# Patient Record
Sex: Male | Born: 1970 | Race: White | Hispanic: Yes | Marital: Married | Smoking: Never smoker
Health system: Southern US, Community
[De-identification: ages and names within clinical notes are randomized; demographics above are authoritative.]

## PROBLEM LIST (undated history)

## (undated) DIAGNOSIS — M1990 Unspecified osteoarthritis, unspecified site: Secondary | ICD-10-CM

## (undated) HISTORY — PX: APPENDECTOMY: SHX54

## (undated) HISTORY — PX: THYROGLOSSAL DUCT CYST: SHX297

---

## 2007-02-11 ENCOUNTER — Ambulatory Visit (HOSPITAL_COMMUNITY): Admission: EM | Admit: 2007-02-11 | Discharge: 2007-02-11 | Payer: Self-pay | Admitting: Emergency Medicine

## 2007-02-11 ENCOUNTER — Encounter (INDEPENDENT_AMBULATORY_CARE_PROVIDER_SITE_OTHER): Payer: Self-pay | Admitting: General Surgery

## 2008-03-09 ENCOUNTER — Ambulatory Visit: Payer: Self-pay | Admitting: Internal Medicine

## 2008-03-09 ENCOUNTER — Encounter (INDEPENDENT_AMBULATORY_CARE_PROVIDER_SITE_OTHER): Payer: Self-pay | Admitting: Family Medicine

## 2008-03-09 ENCOUNTER — Ambulatory Visit: Payer: Self-pay | Admitting: *Deleted

## 2008-03-09 LAB — CONVERTED CEMR LAB
ALT: 12 units/L (ref 0–53)
AST: 14 units/L (ref 0–37)
Albumin: 4 g/dL (ref 3.5–5.2)
Alkaline Phosphatase: 76 units/L (ref 39–117)
BUN: 21 mg/dL (ref 6–23)
Basophils Relative: 0 % (ref 0–1)
CO2: 21 meq/L (ref 19–32)
Calcium: 9.4 mg/dL (ref 8.4–10.5)
Creatinine, Ser: 0.73 mg/dL (ref 0.40–1.50)
Eosinophils Absolute: 0.1 10*3/uL (ref 0.0–0.7)
Eosinophils Relative: 1 % (ref 0–5)
Glucose, Bld: 88 mg/dL (ref 70–99)
HCT: 37.7 % — ABNORMAL LOW (ref 39.0–52.0)
Lymphs Abs: 1.8 10*3/uL (ref 0.7–4.0)
MCHC: 32.9 g/dL (ref 30.0–36.0)
MCV: 88.1 fL (ref 78.0–100.0)
Monocytes Absolute: 0.6 10*3/uL (ref 0.1–1.0)
Monocytes Relative: 9 % (ref 3–12)
Neutrophils Relative %: 63 % (ref 43–77)
RBC: 4.28 M/uL (ref 4.22–5.81)
Sodium: 139 meq/L (ref 135–145)
Total Bilirubin: 0.5 mg/dL (ref 0.3–1.2)
WBC: 6.8 10*3/uL (ref 4.0–10.5)

## 2008-04-01 ENCOUNTER — Ambulatory Visit: Payer: Self-pay | Admitting: Internal Medicine

## 2008-04-01 LAB — CONVERTED CEMR LAB
LDL Cholesterol: 74 mg/dL (ref 0–99)
TSH: 0.786 microintl units/mL (ref 0.350–4.50)

## 2008-04-02 ENCOUNTER — Ambulatory Visit (HOSPITAL_COMMUNITY): Admission: RE | Admit: 2008-04-02 | Discharge: 2008-04-02 | Payer: Self-pay | Admitting: Internal Medicine

## 2008-04-02 ENCOUNTER — Ambulatory Visit: Payer: Self-pay | Admitting: Internal Medicine

## 2008-04-09 ENCOUNTER — Ambulatory Visit: Payer: Self-pay | Admitting: Internal Medicine

## 2008-04-14 ENCOUNTER — Ambulatory Visit: Payer: Self-pay | Admitting: Internal Medicine

## 2008-04-14 LAB — CONVERTED CEMR LAB
ALT: 14 units/L (ref 0–53)
Albumin: 4 g/dL (ref 3.5–5.2)
Alkaline Phosphatase: 67 units/L (ref 39–117)
Basophils Relative: 0 % (ref 0–1)
Glucose, Bld: 92 mg/dL (ref 70–99)
Lymphs Abs: 4.5 10*3/uL — ABNORMAL HIGH (ref 0.7–4.0)
MCHC: 33.4 g/dL (ref 30.0–36.0)
Monocytes Relative: 6 % (ref 3–12)
Neutro Abs: 5.6 10*3/uL (ref 1.7–7.7)
Neutrophils Relative %: 51 % (ref 43–77)
Potassium: 4.2 meq/L (ref 3.5–5.3)
RBC: 4.26 M/uL (ref 4.22–5.81)
Sodium: 138 meq/L (ref 135–145)
Total Protein: 7.3 g/dL (ref 6.0–8.3)
WBC: 10.9 10*3/uL — ABNORMAL HIGH (ref 4.0–10.5)

## 2008-04-16 ENCOUNTER — Ambulatory Visit: Payer: Self-pay | Admitting: Internal Medicine

## 2008-04-22 ENCOUNTER — Ambulatory Visit: Payer: Self-pay | Admitting: Internal Medicine

## 2008-04-29 ENCOUNTER — Ambulatory Visit: Payer: Self-pay | Admitting: Internal Medicine

## 2008-05-06 ENCOUNTER — Ambulatory Visit: Payer: Self-pay | Admitting: Internal Medicine

## 2008-05-13 ENCOUNTER — Ambulatory Visit: Payer: Self-pay | Admitting: Internal Medicine

## 2008-05-20 ENCOUNTER — Ambulatory Visit: Payer: Self-pay | Admitting: Internal Medicine

## 2008-05-27 ENCOUNTER — Ambulatory Visit: Payer: Self-pay | Admitting: Internal Medicine

## 2008-06-03 ENCOUNTER — Ambulatory Visit: Payer: Self-pay | Admitting: Internal Medicine

## 2008-06-10 ENCOUNTER — Ambulatory Visit: Payer: Self-pay | Admitting: Internal Medicine

## 2008-06-17 ENCOUNTER — Ambulatory Visit: Payer: Self-pay | Admitting: Internal Medicine

## 2008-07-18 ENCOUNTER — Ambulatory Visit: Payer: Self-pay | Admitting: Internal Medicine

## 2008-07-25 ENCOUNTER — Ambulatory Visit: Payer: Self-pay | Admitting: Internal Medicine

## 2008-08-31 ENCOUNTER — Ambulatory Visit: Payer: Self-pay | Admitting: Internal Medicine

## 2008-08-31 LAB — CONVERTED CEMR LAB
ALT: 22 units/L (ref 0–53)
AST: 20 units/L (ref 0–37)
Albumin: 4.5 g/dL (ref 3.5–5.2)
Alkaline Phosphatase: 57 units/L (ref 39–117)
Basophils Absolute: 0 10*3/uL (ref 0.0–0.1)
Basophils Relative: 0 % (ref 0–1)
Calcium: 9.7 mg/dL (ref 8.4–10.5)
Chloride: 102 meq/L (ref 96–112)
MCHC: 33.2 g/dL (ref 30.0–36.0)
Monocytes Relative: 8 % (ref 3–12)
Neutro Abs: 6.6 10*3/uL (ref 1.7–7.7)
Neutrophils Relative %: 57 % (ref 43–77)
Platelets: 337 10*3/uL (ref 150–400)
Potassium: 4.1 meq/L (ref 3.5–5.3)
RBC: 4.9 M/uL (ref 4.22–5.81)
RDW: 12.8 % (ref 11.5–15.5)
Sodium: 139 meq/L (ref 135–145)

## 2008-09-02 ENCOUNTER — Ambulatory Visit: Payer: Self-pay | Admitting: Internal Medicine

## 2008-09-19 ENCOUNTER — Ambulatory Visit: Payer: Self-pay | Admitting: Internal Medicine

## 2008-10-05 ENCOUNTER — Ambulatory Visit: Payer: Self-pay | Admitting: Internal Medicine

## 2008-10-12 ENCOUNTER — Ambulatory Visit: Payer: Self-pay | Admitting: Internal Medicine

## 2008-10-21 ENCOUNTER — Ambulatory Visit: Payer: Self-pay | Admitting: Internal Medicine

## 2008-11-08 ENCOUNTER — Ambulatory Visit: Payer: Self-pay | Admitting: Internal Medicine

## 2008-11-08 LAB — CONVERTED CEMR LAB
ALT: 28 units/L (ref 0–53)
AST: 20 units/L (ref 0–37)
Alkaline Phosphatase: 70 units/L (ref 39–117)
BUN: 16 mg/dL (ref 6–23)
Basophils Absolute: 0 10*3/uL (ref 0.0–0.1)
Basophils Relative: 0 % (ref 0–1)
Calcium: 9.8 mg/dL (ref 8.4–10.5)
Chloride: 101 meq/L (ref 96–112)
Creatinine, Ser: 0.96 mg/dL (ref 0.40–1.50)
Eosinophils Relative: 0 % (ref 0–5)
Lymphocytes Relative: 15 % (ref 12–46)
MCHC: 33.8 g/dL (ref 30.0–36.0)
Neutro Abs: 7.7 10*3/uL (ref 1.7–7.7)
Platelets: 336 10*3/uL (ref 150–400)
Potassium: 4.4 meq/L (ref 3.5–5.3)
RDW: 12.4 % (ref 11.5–15.5)
Sed Rate: 9 mm/hr (ref 0–16)

## 2008-12-01 ENCOUNTER — Ambulatory Visit: Payer: Self-pay | Admitting: Internal Medicine

## 2009-01-17 ENCOUNTER — Ambulatory Visit: Payer: Self-pay | Admitting: Internal Medicine

## 2009-05-25 ENCOUNTER — Ambulatory Visit: Payer: Self-pay | Admitting: Internal Medicine

## 2009-06-14 ENCOUNTER — Ambulatory Visit (HOSPITAL_COMMUNITY): Admission: RE | Admit: 2009-06-14 | Discharge: 2009-06-14 | Payer: Self-pay | Admitting: Internal Medicine

## 2009-07-04 ENCOUNTER — Ambulatory Visit: Payer: Self-pay | Admitting: Internal Medicine

## 2009-07-26 ENCOUNTER — Ambulatory Visit: Payer: Self-pay | Admitting: Internal Medicine

## 2009-08-14 ENCOUNTER — Ambulatory Visit: Payer: Self-pay | Admitting: Internal Medicine

## 2009-09-06 ENCOUNTER — Ambulatory Visit: Payer: Self-pay | Admitting: Internal Medicine

## 2009-09-06 LAB — CONVERTED CEMR LAB
AST: 25 units/L (ref 0–37)
Albumin: 4.5 g/dL (ref 3.5–5.2)
Alkaline Phosphatase: 57 units/L (ref 39–117)
BUN: 13 mg/dL (ref 6–23)
Basophils Absolute: 0 10*3/uL (ref 0.0–0.1)
Basophils Relative: 0 % (ref 0–1)
Creatinine, Ser: 0.77 mg/dL (ref 0.40–1.50)
Eosinophils Absolute: 0 10*3/uL (ref 0.0–0.7)
Eosinophils Relative: 0 % (ref 0–5)
Glucose, Bld: 104 mg/dL — ABNORMAL HIGH (ref 70–99)
HCT: 46.5 % (ref 39.0–52.0)
Hemoglobin: 15.1 g/dL (ref 13.0–17.0)
Lymphocytes Relative: 15 % (ref 12–46)
MCHC: 32.5 g/dL (ref 30.0–36.0)
Monocytes Absolute: 0.4 10*3/uL (ref 0.1–1.0)
Platelets: 345 10*3/uL (ref 150–400)
RDW: 13.3 % (ref 11.5–15.5)
Total Bilirubin: 0.5 mg/dL (ref 0.3–1.2)

## 2009-09-18 ENCOUNTER — Encounter (INDEPENDENT_AMBULATORY_CARE_PROVIDER_SITE_OTHER): Payer: Self-pay | Admitting: *Deleted

## 2009-11-01 ENCOUNTER — Ambulatory Visit: Payer: Self-pay | Admitting: Internal Medicine

## 2009-11-01 LAB — CONVERTED CEMR LAB
Albumin: 4.4 g/dL (ref 3.5–5.2)
BUN: 21 mg/dL (ref 6–23)
Basophils Relative: 0 % (ref 0–1)
CO2: 23 meq/L (ref 19–32)
Eosinophils Relative: 0 % (ref 0–5)
Glucose, Bld: 99 mg/dL (ref 70–99)
HCT: 45.2 % (ref 39.0–52.0)
Hemoglobin: 15.1 g/dL (ref 13.0–17.0)
MCHC: 33.4 g/dL (ref 30.0–36.0)
Monocytes Absolute: 0.6 10*3/uL (ref 0.1–1.0)
Monocytes Relative: 5 % (ref 3–12)
Neutro Abs: 9.2 10*3/uL — ABNORMAL HIGH (ref 1.7–7.7)
Potassium: 4.7 meq/L (ref 3.5–5.3)
RBC: 4.73 M/uL (ref 4.22–5.81)
RDW: 12.7 % (ref 11.5–15.5)
Sodium: 140 meq/L (ref 135–145)
Total Bilirubin: 0.4 mg/dL (ref 0.3–1.2)
Total Protein: 7.4 g/dL (ref 6.0–8.3)

## 2010-02-05 ENCOUNTER — Ambulatory Visit: Payer: Self-pay | Admitting: Internal Medicine

## 2010-02-05 LAB — CONVERTED CEMR LAB
ALT: 17 units/L (ref 0–53)
Albumin: 4.1 g/dL (ref 3.5–5.2)
CO2: 27 meq/L (ref 19–32)
Calcium: 9.4 mg/dL (ref 8.4–10.5)
Chloride: 104 meq/L (ref 96–112)
Hemoglobin: 14.4 g/dL (ref 13.0–17.0)
Lymphs Abs: 3 10*3/uL (ref 0.7–4.0)
Monocytes Relative: 6 % (ref 3–12)
Neutro Abs: 5.4 10*3/uL (ref 1.7–7.7)
Neutrophils Relative %: 59 % (ref 43–77)
Potassium: 4.5 meq/L (ref 3.5–5.3)
RBC: 4.6 M/uL (ref 4.22–5.81)
Sodium: 139 meq/L (ref 135–145)
Total Protein: 6.8 g/dL (ref 6.0–8.3)
WBC: 9.1 10*3/uL (ref 4.0–10.5)

## 2010-06-14 ENCOUNTER — Encounter (INDEPENDENT_AMBULATORY_CARE_PROVIDER_SITE_OTHER): Payer: Self-pay | Admitting: Internal Medicine

## 2010-06-14 LAB — CONVERTED CEMR LAB
AST: 27 units/L (ref 0–37)
Albumin: 3.9 g/dL (ref 3.5–5.2)
Alkaline Phosphatase: 61 units/L (ref 39–117)
Eosinophils Absolute: 0.1 10*3/uL (ref 0.0–0.7)
Glucose, Bld: 91 mg/dL (ref 70–99)
Lymphocytes Relative: 17 % (ref 12–46)
Lymphs Abs: 1.8 10*3/uL (ref 0.7–4.0)
MCV: 92.8 fL (ref 78.0–100.0)
Neutro Abs: 8 10*3/uL — ABNORMAL HIGH (ref 1.7–7.7)
Neutrophils Relative %: 75 % (ref 43–77)
Platelets: 370 10*3/uL (ref 150–400)
Potassium: 4.8 meq/L (ref 3.5–5.3)
Sodium: 140 meq/L (ref 135–145)
Total Protein: 7 g/dL (ref 6.0–8.3)
WBC: 10.6 10*3/uL — ABNORMAL HIGH (ref 4.0–10.5)

## 2010-10-16 NOTE — Letter (Signed)
Summary: Generic Letter  [facility]  [address]   [place], [place] [address]   Phone: [phone]  Fax: [phone]    09/18/2009  [facility] [address] [place], [place]  [address]  Dear Mr. [facility] IBARRA,   Dr. Reche Dixon has reviewed your lab work, it was normal. Follow-up in 8 weeks as discussed on your last visit.        Sincerely,   Gaylyn Cheers RN

## 2010-11-09 ENCOUNTER — Other Ambulatory Visit (HOSPITAL_COMMUNITY): Payer: Self-pay | Admitting: Family Medicine

## 2010-11-09 ENCOUNTER — Encounter (INDEPENDENT_AMBULATORY_CARE_PROVIDER_SITE_OTHER): Payer: Self-pay | Admitting: *Deleted

## 2010-11-09 DIAGNOSIS — M1990 Unspecified osteoarthritis, unspecified site: Secondary | ICD-10-CM

## 2010-11-09 LAB — CONVERTED CEMR LAB
AST: 25 units/L (ref 0–37)
Albumin: 4.4 g/dL (ref 3.5–5.2)
Alkaline Phosphatase: 65 units/L (ref 39–117)
Basophils Absolute: 0 10*3/uL (ref 0.0–0.1)
Basophils Relative: 0 % (ref 0–1)
Eosinophils Absolute: 0.1 10*3/uL (ref 0.0–0.7)
MCHC: 33.3 g/dL (ref 30.0–36.0)
MCV: 97.3 fL (ref 78.0–100.0)
Neutrophils Relative %: 59 % (ref 43–77)
Platelets: 308 10*3/uL (ref 150–400)
Potassium: 4.5 meq/L (ref 3.5–5.3)
Sodium: 141 meq/L (ref 135–145)
Total Protein: 7.7 g/dL (ref 6.0–8.3)
WBC: 8.4 10*3/uL (ref 4.0–10.5)

## 2010-11-20 ENCOUNTER — Other Ambulatory Visit: Payer: Self-pay | Admitting: Family Medicine

## 2010-11-21 ENCOUNTER — Ambulatory Visit (HOSPITAL_COMMUNITY)
Admission: RE | Admit: 2010-11-21 | Discharge: 2010-11-21 | Disposition: A | Discharge status: Home / Self Care | Payer: Self-pay | Source: Ambulatory Visit | Attending: Family Medicine | Admitting: Family Medicine

## 2010-11-21 DIAGNOSIS — Z79899 Other long term (current) drug therapy: Secondary | ICD-10-CM | POA: Insufficient documentation

## 2010-11-21 DIAGNOSIS — Z13820 Encounter for screening for osteoporosis: Secondary | ICD-10-CM | POA: Insufficient documentation

## 2010-11-21 DIAGNOSIS — M1990 Unspecified osteoarthritis, unspecified site: Secondary | ICD-10-CM

## 2010-12-01 ENCOUNTER — Encounter (INDEPENDENT_AMBULATORY_CARE_PROVIDER_SITE_OTHER): Payer: Self-pay | Admitting: *Deleted

## 2010-12-01 LAB — CONVERTED CEMR LAB
AST: 25 units/L (ref 0–37)
Albumin: 4.3 g/dL (ref 3.5–5.2)
Alkaline Phosphatase: 61 units/L (ref 39–117)
BUN: 17 mg/dL (ref 6–23)
Basophils Absolute: 0 10*3/uL (ref 0.0–0.1)
Basophils Relative: 0 % (ref 0–1)
Eosinophils Absolute: 0.1 10*3/uL (ref 0.0–0.7)
MCHC: 33.7 g/dL (ref 30.0–36.0)
Monocytes Absolute: 0.7 10*3/uL (ref 0.1–1.0)
Neutro Abs: 4.9 10*3/uL (ref 1.7–7.7)
Neutrophils Relative %: 59 % (ref 43–77)
Potassium: 4.1 meq/L (ref 3.5–5.3)
RDW: 12.7 % (ref 11.5–15.5)
Sodium: 141 meq/L (ref 135–145)
Total Bilirubin: 0.3 mg/dL (ref 0.3–1.2)

## 2011-01-29 NOTE — Op Note (Signed)
NAME:  Jacob Cole, Jacob Cole      ACCOUNT NO.:  [id number]   MEDICAL RECORD NO.:  [id number]          PATIENT TYPE:  EMS   LOCATION:  ED                           FACILITY:  [facility]   PHYSICIAN:  Anselm Pancoast. Weatherly, M.D.DATE OF BIRTH:  [birth year]   DATE OF PROCEDURE:  02/11/2007  DATE OF DISCHARGE:                               OPERATIVE REPORT   PREOPERATIVE DIAGNOSIS:  Acute appendicitis.   POSTOPERATIVE DIAGNOSIS:  Acute appendicitis.   OPERATION PERFORMED:  Appendectomy.   SURGEON:  Anselm Pancoast. Zachery Dakins, M.D.   ANESTHESIA:  General.   INDICATIONS FOR PROCEDURE:  Jacob Cole is a 40 year old  [place] American who came to the emergency room after being seen in an  urgent care with abdominal pain of about half day's duration.  He was  nauseated, was sent over, seen by the ER physician.  His white count was  21,000.  He gave history of actually epigastric pain and after they  further examined him and he was vaguely tender in the lower abdomen, CT  showed appendicitis in a retrocecal medial aspect area.  I was called  about 2:30 a.m., came in, examined him, gave him 3 g of Unasyn and  permission obtained for an open appendectomy.  Patient has no other  chronic medical problems and was taken over to the operative suite.   DESCRIPTION OF PROCEDURE:  Induction of general anesthesia endotracheal  tube, oral tube to the stomach and then the abdomen was clipped in the  right lower quadrant and then prepped with Betadine solution and draped  in a sterile manner.  A McBurney incision was made.  Sharp dissection  went through the skin, subcutaneous tissue, he was fairly thin, the  external oblique, internal oblique and then the peritoneum was picked up  between two hemostats and carefully opened into the peritoneal cavity.  Appendiceal and a Goulet were placed.  I could feel the markedly  inflamed appendix in the medial aspect and this was kind of manipulated  up into the  wound.  There was a very thick exudate on the appendix but  it does not appear to be actually ruptured.  I cultured the exudate  aerobid and anaerobically and then the appendiceal mesentery was clamped  between [facility] and ligated with 2-0 Vicryl.  The appendix was inflamed  right down to its base.  The base was clamped with hemostat and then  tied with 2-0 Vicryl and a second tie was placed as the appendix was  nearly gangrenous at this point.  The appendix was removed from the  field.  A pursestring suture of 3-0 silk placed, the stump inverted and  a Z-stitch was placed as to reinforced the closure.  The cecum was  placed back in its normal position and thoroughly irrigated sucking out  the fluids and returned clear and then the omentum was placed down over  the area where the appendix had been removed and then the peritoneum and  transversalis was closed with running 2-0 Vicryl and then the internal  oblique was closed with the running 2-0 Vicryl also.  External oblique  was closed with running 2-0  Vicryl.  The wound was irrigated between  layers and then the Scarpa fascia was closed with a few 4-0 Vicryl  interrupted sutures and three 4-0 Vicryl interrupted subcuticular and  half inch Steri-Strips and benzoin on the skin.  The patient tolerated  the procedure  nicely and I do want the patient to get a couple of doses of IV  antibiotics postoperatively and if he is not nauseated, we will let him  be released after lunch in the a.m. later today.  Sponge, needle and  instrument counts were correct x2.  The estimated blood loss was  minimal.           ______________________________  Anselm Pancoast. Zachery Dakins, M.D.     WJW/MEDQ  D:  02/11/2007  T:  02/11/2007  Job:  [id number]

## 2011-01-29 NOTE — H&P (Signed)
NAME:  Jacob Cole      ACCOUNT NO.:  [id number]   MEDICAL RECORD NO.:  [id number]          PATIENT TYPE:  EMS   LOCATION:  ED                           FACILITY:  [facility]   PHYSICIAN:  Anselm Pancoast. Weatherly, M.D.DATE OF BIRTH:  [birth year]   DATE OF ADMISSION:  02/10/2007  DATE OF DISCHARGE:                              HISTORY & PHYSICAL   CHIEF COMPLAINT:  Abdominal pain.   HISTORY:  Jacob Cole is a 40 year old [place] American who  came to the emergency room.  I think he was actually seen [facility], earlier, for abdominal pain of about half day's duration.  He was  seen in the emergency room by the emergency room physician and his  initial symptoms described as epigastric pain.  He arrived here at about  8:30.  The white blood cell count was markedly elevated.  He was not  febrile.  He was given nausea medicine and pain medication.  Then on re-  examination, felt he was vaguely tender in the lower abdomen.  CT of the  abdomen and the pelvis was obtained and showed a markedly inflamed  appendix, located very medially in the depths of the abdomen, not truly  a retrocecal but certainly not in the right lower quadrant.  I was  called, came in and examined the patient and agreed with the diagnosis.  He was given 3 g of Unasyn and permission obtained for an appendectomy.   PAST MEDICAL HISTORY:  No chronic medical problems.   ALLERGIES:  None.   SOCIAL HISTORY:  I am not sure what type of work he does.  He works in  [employer].  He has friends with him that speak better English than he  does.   PAST SURGICAL HISTORY:  No previous abdominal surgery.   PHYSICAL EXAMINATION:  VITAL SIGNS:  Temperature at approximately 2 a.m.  showed a temperature of 99.3, his blood pressure was 120/73, pulse is  108 and his respirations of 18.  LUNGS:  Clear.  CARDIAC:  Normal sinus rhythm.  ABDOMEN:  He is vaguely tender in the right lower quadrant.  It is not  really  true rebound type thing.  CENTRAL NERVOUS SYSTEM:  Physiologic.  EXTREMITIES:  There is no pedal edema.   ADMISSION IMPRESSION:  Acute appendicitis.   PLAN:  Plan for open appendectomy.           ______________________________  Anselm Pancoast. Zachery Dakins, M.D.    WJW/MEDQ  D:  02/11/2007  T:  02/11/2007  Job:  [id number]

## 2012-08-04 ENCOUNTER — Emergency Department (HOSPITAL_COMMUNITY): Payer: No Typology Code available for payment source

## 2012-08-04 ENCOUNTER — Encounter (HOSPITAL_COMMUNITY): Payer: Self-pay | Admitting: Radiology

## 2012-08-04 ENCOUNTER — Emergency Department (HOSPITAL_COMMUNITY)
Admission: EM | Admit: 2012-08-04 | Discharge: 2012-08-04 | Disposition: A | Discharge status: Home / Self Care | Payer: No Typology Code available for payment source | Attending: Emergency Medicine | Admitting: Emergency Medicine

## 2012-08-04 DIAGNOSIS — IMO0002 Reserved for concepts with insufficient information to code with codable children: Secondary | ICD-10-CM | POA: Insufficient documentation

## 2012-08-04 DIAGNOSIS — S0993XA Unspecified injury of face, initial encounter: Secondary | ICD-10-CM | POA: Insufficient documentation

## 2012-08-04 DIAGNOSIS — M129 Arthropathy, unspecified: Secondary | ICD-10-CM | POA: Insufficient documentation

## 2012-08-04 DIAGNOSIS — S199XXA Unspecified injury of neck, initial encounter: Secondary | ICD-10-CM | POA: Insufficient documentation

## 2012-08-04 DIAGNOSIS — Y92410 Unspecified street and highway as the place of occurrence of the external cause: Secondary | ICD-10-CM | POA: Insufficient documentation

## 2012-08-04 DIAGNOSIS — T148XXA Other injury of unspecified body region, initial encounter: Secondary | ICD-10-CM

## 2012-08-04 DIAGNOSIS — Z79899 Other long term (current) drug therapy: Secondary | ICD-10-CM | POA: Insufficient documentation

## 2012-08-04 DIAGNOSIS — Y9389 Activity, other specified: Secondary | ICD-10-CM | POA: Insufficient documentation

## 2012-08-04 HISTORY — DX: Unspecified osteoarthritis, unspecified site: M19.90

## 2012-08-04 MED ORDER — METHOCARBAMOL 500 MG PO TABS: 1000.0000 mg | ORAL_TABLET | Freq: Four times a day (QID) | ORAL | Status: DC
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
## 2012-08-04 NOTE — ED Provider Notes (Signed)
Medical screening examination/treatment/procedure(s) were performed by non-physician practitioner and as supervising physician I was immediately available for consultation/collaboration.   Gavin Pound. Elridge Stemm, MD 08/04/12 1134

## 2012-08-04 NOTE — ED Notes (Signed)
Pt removed off backboard at this time 

## 2012-08-04 NOTE — ED Notes (Signed)
Ambulated pt to bathroom. Pt denies any dizziness at this time

## 2012-08-04 NOTE — ED Notes (Signed)
PA at bedside.
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
## 2012-09-23 ENCOUNTER — Ambulatory Visit (INDEPENDENT_AMBULATORY_CARE_PROVIDER_SITE_OTHER): Payer: Self-pay | Admitting: Emergency Medicine

## 2012-09-23 ENCOUNTER — Encounter: Payer: Self-pay | Admitting: Emergency Medicine

## 2012-09-23 VITALS — BP 135/84 | HR 69 | Ht 70.5 in | Wt 174.0 lb

## 2012-09-23 DIAGNOSIS — B351 Tinea unguium: Secondary | ICD-10-CM

## 2012-09-23 DIAGNOSIS — M069 Rheumatoid arthritis, unspecified: Secondary | ICD-10-CM

## 2012-09-23 LAB — HEPATIC FUNCTION PANEL
AST: 82 U/L — ABNORMAL HIGH (ref 0–37)
Albumin: 4.5 g/dL (ref 3.5–5.2)
Alkaline Phosphatase: 68 U/L (ref 39–117)
Indirect Bilirubin: 0.5 mg/dL (ref 0.0–0.9)
Total Bilirubin: 0.6 mg/dL (ref 0.3–1.2)

## 2012-09-23 NOTE — Assessment & Plan Note (Signed)
Doing well by patient report on methotrexate and prednisone.  Continue current medications.  He will notify me when he needs refills.

## 2012-09-23 NOTE — Assessment & Plan Note (Signed)
Reviewed treatment options with patient including 3 months of oral antifungal vs nothing.  Informed him that the cost of the lab work would be $65 and the medication would be $300-500 per month.  He would like to receive treatment.  Liver panel drawn today; if normal will prescribe terbinafine 250mg  daily x12 weeks.  Will need to follow up in 8-12 weeks to repeat the liver panel.

## 2012-09-23 NOTE — Patient Instructions (Addendum)
It was nice to meet you! Call our office when you need refills.  Our number is [phone]. We are going to check your liver enzymes.  I will call you with the results.  If they are good I will send the medicine to treat the toe nail fungus.  It will cost $300-500 a month and you need to take it for 3 months. Follow up to retest your liver enzymes 2 months after starting the medicine.  Fue Psychiatrist conocerte! Llame a nuestra oficina si necesita recambios. Nuestro nmero es [phone] a [id number]. Vamos a comprobar las enzimas hepticas. Te voy a Futures trader. Si son buenos enviar el medicamento para tratar el hongo de la ua del dedo del pie. Costar $ 300-500 al mes y hay que tomarlo durante 3 meses. Seguimiento para volver a probar tus enzimas hepticas 2 meses despus de Advice worker.

## 2012-09-23 NOTE — Progress Notes (Signed)
  Subjective:    Patient ID: Jacob Cole, male    DOB: December 15, 1970, 42 y.o.   MRN: [id number]  HPI Ransom Nickson is here for a new patient appt and toe nail issue.  1. I have reviewed and updated the following as appropriate: allergies, current medications, past family history, past medical history, past social history, past surgical history and problem list PMHx: RA  FHx: HLD in father SHx: never smoker  2. Toe nail issue: Reports longstanding yellow and brittle great toe nail, worse on the R.  Wants to get it treated.  Review of Systems Patient Information Form: Screening and ROS  Do you feel safe in relationships? yes PHQ-2:negative  Review of Symptoms  General:  Negative for nexplained weight loss, fever Skin: Negative for new or changing mole, sore that won't heal HEENT: Negative for trouble hearing, trouble seeing, ringing in ears, mouth sores, hoarseness, change in voice, dysphagia. CV:  Negative for chest pain, dyspnea, edema, palpitations Resp: Negative for +cough, dyspnea, hemoptysis GI: Negative for +nausea, vomiting, diarrhea, constipation, abdominal pain, melena, hematochezia. GU: Negative for dysuria, incontinence, urinary hesitance, hematuria, vaginal or penile discharge, polyuria, sexual difficulty, lumps in testicle or breasts MSK: Negative for muscle cramps or aches, + joint pain or swelling Neuro: Negative for headaches, weakness, numbness, dizziness, passing out/fainting Psych: Negative for depression, anxiety, memory problems      Objective:   Physical Exam BP 135/84  Pulse 69  Ht 5' 10.5" (1.791 m)  Wt 174 lb (78.926 kg)  BMI 24.61 kg/m2 Gen: alert, cooperative, NAD HEENT: AT/[place], sclera white, PERRL, MMM, no pharyngeal erythema or edema Neck: supple, no LAD, thyroid normal CV: RRR, no murmurs Pulm: CTAB, no wheezes or rales Abd: +BS, soft, NTND Ext: no edema, 2+ DP pulses bilaterally Neuro: no focal deficits Toenail: right great  toe slightly yellow and friable, very mild on left great toe, no other nail involvement     Assessment & Plan:

## 2012-09-24 ENCOUNTER — Telehealth: Payer: Self-pay | Admitting: Emergency Medicine

## 2012-09-24 ENCOUNTER — Other Ambulatory Visit: Payer: Self-pay | Admitting: Emergency Medicine

## 2012-09-24 NOTE — Telephone Encounter (Signed)
Liver enzymes are mildly elevated. This is a side effect of the methotrexate that he takes for RA. It is not a dangerous increase. However, with this mild increase, I will not give anti-fungal medications that could increase these enzymes further. I would like to taper his prednisone and see if that will allow his native immune system to get rid of the fungus. Tapering the prednisone should not cause an increase in his RA disease activity. I would like him to do the following prednisone taper  - Week 1 and 2: take 4mg  prednisone daily  - Week 3 and 4: take 3mg  prednisone daily  - Week 5 and 6: take 2mg  prednisone daily  - Week 7 and 8: take 1mg  prednisone daily  - Week 9: stop all prednisone  He should call the office if he starts having muscle or joint aches, dizziness, or muscle weakness.  I would like him to follow up with me in 2-3 months.

## 2012-09-24 NOTE — Telephone Encounter (Signed)
Liver enzymes are mildly elevated. This is a side effect of the methotrexate that he takes for RA. It is not a dangerous increase. However, with this mild increase, I will not give anti-fungal medications that could increase these enzymes further. I would like to taper his prednisone and see if that will allow his native immune system to get rid of the fungus. Tapering the prednisone should not cause an increase in his RA disease activity. I would like him to do the following prednisone taper  - Week 1 and 2: take 4mg prednisone daily  - Week 3 and 4: take 3mg prednisone daily  - Week 5 and 6: take 2mg prednisone daily  - Week 7 and 8: take 1mg prednisone daily  - Week 9: stop all prednisone  He should call the office if he starts having muscle or joint aches, dizziness, or muscle weakness.  I would like him to follow up with me in 2-3 months.  

## 2012-09-25 NOTE — Telephone Encounter (Signed)
I called pt but the phone number is not pt phone number it is a relative phone number. I leave a msg hopefully pt will call us back.  [employer]

## 2012-09-28 ENCOUNTER — Other Ambulatory Visit: Payer: Self-pay | Admitting: Emergency Medicine

## 2012-09-28 MED ORDER — PREDNISONE 1 MG PO TABS: ORAL_TABLET | ORAL | Status: DC

## 2012-09-28 NOTE — Telephone Encounter (Signed)
Pt call us back pt use [facility] on [address]. I explain Dr's instruction and pt will go for med at [facility]. PT phone number is update in the system. [phone]. [employer]

## 2012-10-02 ENCOUNTER — Encounter: Payer: Self-pay | Admitting: Family Medicine

## 2012-10-02 ENCOUNTER — Ambulatory Visit (INDEPENDENT_AMBULATORY_CARE_PROVIDER_SITE_OTHER): Payer: Self-pay | Admitting: Family Medicine

## 2012-10-02 VITALS — BP 118/78 | HR 67 | Temp 96.8°F | Ht 70.5 in | Wt 174.6 lb

## 2012-10-02 DIAGNOSIS — R7989 Other specified abnormal findings of blood chemistry: Secondary | ICD-10-CM

## 2012-10-02 DIAGNOSIS — M069 Rheumatoid arthritis, unspecified: Secondary | ICD-10-CM

## 2012-10-02 MED ORDER — METHOTREXATE 2.5 MG PO TABS: 7.5000 mg | ORAL_TABLET | ORAL | Status: DC

## 2012-10-02 MED ORDER — NAPROXEN SODIUM ER 500 MG PO TB24: 500.0000 mg | ORAL_TABLET | Freq: Every day | ORAL | Status: DC

## 2012-10-02 MED ORDER — COLCHICINE 0.6 MG PO TABS: 0.6000 mg | ORAL_TABLET | Freq: Every day | ORAL | Status: DC

## 2012-10-02 MED ORDER — FOLIC ACID 1 MG PO TABS: 1.0000 mg | ORAL_TABLET | Freq: Every day | ORAL | Status: DC

## 2012-10-02 NOTE — Patient Instructions (Addendum)
Thank you for coming in today, it was good to see you Change your methotrexate to 7.5mg  per week. Continue prednisone taper Follow up with Dr. Elwyn Reach in 6 weeks

## 2012-10-04 NOTE — Assessment & Plan Note (Addendum)
Currently on prednisone taper.  Instructions for methotrexate say take daily, however he is using 4x per week.  Can not find any information on Uptodate about 4x per week dosing or daily dosing for RA.  Will change him to 7.5 mg weekly.  Continue prednisone taper and naproxen for flares. Needs to see rheumatologist but does not have insurance, [facility]

## 2012-10-04 NOTE — Assessment & Plan Note (Addendum)
Likely related to methotrexate use.  Denies EtOH. Currently uninsured, and does not want costs of additional labwork at this time.  Will hold off on hepatitis panel for now., but I think that he should have one once he has the orange card.

## 2012-10-04 NOTE — Progress Notes (Signed)
  Subjective:    Patient ID: Jacob Cole, male    DOB: 09/24/1970, 42 y.o.   MRN: [id number]  HPI RA: Patient here for medication refills.  Has a history of RA [facility]  Has not been seen by rheumatologist.  Currently using methotrexate 2.5mg  4 times per week.  He is on a prednisone taper as well.  He is tolerating his medication well and has had some mild joint pain since starting the prednisone taper.    2. Elevated LFT's:  LFT's elevated on previous lab work.  He denies any EtOH use.  No known hepatitis.  Denies any abdominal pain, confusion, itching, yellowing of skin/eyes.  Review of Systems Per HPI    Objective:   Physical Exam  Constitutional: He appears well-nourished. No distress.  HENT:  Head: Normocephalic and atraumatic.  Eyes: No scleral icterus.  Cardiovascular: Normal rate and regular rhythm.   Pulmonary/Chest: Effort normal and breath sounds normal.  Abdominal: Soft. Bowel sounds are normal. He exhibits no distension. There is no tenderness.  Musculoskeletal: He exhibits no edema.       No joint swelling seen.   Neurological: He is alert.          Assessment & Plan:

## 2012-11-12 ENCOUNTER — Ambulatory Visit (INDEPENDENT_AMBULATORY_CARE_PROVIDER_SITE_OTHER): Payer: Self-pay | Admitting: Emergency Medicine

## 2012-11-12 ENCOUNTER — Encounter: Payer: Self-pay | Admitting: Emergency Medicine

## 2012-11-12 VITALS — BP 122/73 | HR 86 | Ht 70.5 in | Wt 176.0 lb

## 2012-11-12 DIAGNOSIS — M069 Rheumatoid arthritis, unspecified: Secondary | ICD-10-CM

## 2012-11-12 NOTE — Progress Notes (Signed)
  Subjective:    Patient ID: Jacob Cole, male    DOB: Nov 23, 1970, 42 y.o.   MRN: [id number]  HPI Jacob Cole is here for f/u of RA.  He reports that he is doing well with the prednisone taper.  Will be off prednisone in about 2 weeks.  Compliant with change of methotrexate to 7.5mg  weekly.  Does report some mild pain in his low back and right hand/wrist, but nothing that bothers him.  I have reviewed and updated the following as appropriate: allergies, current medications, past family history, past medical history, past social history, past surgical history and problem list SHx: non smoker  Review of Systems See hpi    Objective:   Physical Exam BP 122/73  Pulse 86  Ht 5' 10.5" (1.791 m)  Wt 176 lb (79.833 kg)  BMI 24.89 kg/m2 Gen: alert, cooperative, NAD HEENT: AT/[place], sclera white, MMM Neck: supple CV: RRR, no murmurs Pulm: CTAB, no wheezes or rales Ext: no erythema, edema or deformity of right hand/wrist, full AROM, no tenderness to palpation     Assessment & Plan:

## 2012-11-12 NOTE — Progress Notes (Signed)
Interpreter Wyvonnia Dusky for [employer]

## 2012-11-12 NOTE — Assessment & Plan Note (Signed)
Doing well with prednisone taper and methotrexate weekly.  Will follow up in 2-3 months to see how things are going off the prednisone.

## 2012-11-12 NOTE — Patient Instructions (Addendum)
Fue bueno verte!  Parece que todo va bien con bajar la prednisona.  Yo quiero comprobar algunos laboratorios para su hgado. Avsame cuando llegue la tarjeta naranja, y voy a ordenar las [address].  El seguimiento en 2-3 meses para ver cmo [employer]

## 2013-01-12 ENCOUNTER — Ambulatory Visit: Payer: No Typology Code available for payment source | Admitting: Emergency Medicine

## 2013-01-15 ENCOUNTER — Ambulatory Visit (INDEPENDENT_AMBULATORY_CARE_PROVIDER_SITE_OTHER): Payer: No Typology Code available for payment source | Admitting: Emergency Medicine

## 2013-01-15 ENCOUNTER — Encounter: Payer: Self-pay | Admitting: Emergency Medicine

## 2013-01-15 VITALS — BP 130/78 | HR 84 | Ht 70.5 in | Wt 168.0 lb

## 2013-01-15 DIAGNOSIS — M069 Rheumatoid arthritis, unspecified: Secondary | ICD-10-CM

## 2013-01-15 LAB — COMPREHENSIVE METABOLIC PANEL
AST: 46 U/L — ABNORMAL HIGH (ref 0–37)
Alkaline Phosphatase: 73 U/L (ref 39–117)
BUN: 20 mg/dL (ref 6–23)
Creat: 0.98 mg/dL (ref 0.50–1.35)
Glucose, Bld: 88 mg/dL (ref 70–99)
Total Bilirubin: 0.7 mg/dL (ref 0.3–1.2)

## 2013-01-15 LAB — CBC
HCT: 41.1 % (ref 39.0–52.0)
Hemoglobin: 14.4 g/dL (ref 13.0–17.0)
MCH: 31.4 pg (ref 26.0–34.0)
MCHC: 35 g/dL (ref 30.0–36.0)
MCV: 89.5 fL (ref 78.0–100.0)
RDW: 12.7 % (ref 11.5–15.5)

## 2013-01-15 MED ORDER — FOLIC ACID 1 MG PO TABS: 1.0000 mg | ORAL_TABLET | Freq: Every day | ORAL | Status: AC

## 2013-01-15 MED ORDER — METHOTREXATE 2.5 MG PO TABS: 7.5000 mg | ORAL_TABLET | ORAL | Status: DC

## 2013-01-15 NOTE — Addendum Note (Signed)
Addended by: Phebe Colla on: 01/15/2013 04:57 PM   Modules accepted: Orders

## 2013-01-15 NOTE — Progress Notes (Signed)
  Subjective:    Patient ID: Jacob Cole, male    DOB: [birth year], 42 y.o.   MRN: [id number]  HPI [name] is here for RA f/u.  Spanish interpreter was present for entire visit.  He reports that 3-4 weeks ago he stopped all his medication.  Since then, 1-2 times a week, he will have a little pain in his hands or knees and will take a naprosyn + prednisone 1mg  and get good relief.  He mostly will get the pain in his left hand, the 2nd MCP joint.    I have reviewed and updated the following as appropriate: allergies and current medications SHx: never smoker  Review of Systems See HPI    Objective:   Physical Exam BP 130/78  Pulse 84  Ht 5' 10.5" (1.791 m)  Wt 168 lb (76.204 kg)  BMI 23.76 kg/m2 Gen: alert, cooperative, NAD HEENT: AT/[place], sclera white, MMM CV: RRR, no murmurs Pulm: CTAB, no wheezes or rales Left hand: mild ulnar deviation of all fingers, MCP joints non-tender, mild pain with grip in 2nd MCP joint     Assessment & Plan:

## 2013-01-15 NOTE — Progress Notes (Signed)
Interpreter Wyvonnia Dusky for Dr Elwyn Reach

## 2013-01-15 NOTE — Patient Instructions (Addendum)
Fue bueno verte!  Por favor, reinicie el metotrexato, 3 tabletas una vez a la semana. Esto es para evitar que el RA de Licensed conveyancer.  Tome cido flico 1 comprimido al [employer].  Tome Naprosyn slo cuando sea necesario para el dolor.  Haga un seguimiento por 3 meses o antes si su dolor est empeorando.

## 2013-01-15 NOTE — Assessment & Plan Note (Addendum)
Will restart patient on Methotrexate 7.5mg  once weekly and folic acid daily.  Naprosyn as needed.  Stop all prednisone.  Discussed extensively the disease process and importance of staying on methotrexate.  Will check cbc and cmp today.  Follow up in 3 months or sooner if pain worsening.  Will need to monitor cbc and lfts periodically.

## 2013-02-10 ENCOUNTER — Other Ambulatory Visit: Payer: Self-pay | Admitting: Family Medicine

## 2013-02-10 DIAGNOSIS — M069 Rheumatoid arthritis, unspecified: Secondary | ICD-10-CM

## 2013-02-10 MED ORDER — METHOTREXATE 2.5 MG PO TABS: 7.5000 mg | ORAL_TABLET | ORAL | Status: AC

## 2013-02-10 MED ORDER — NAPROXEN 500 MG PO TABS: 500.0000 mg | ORAL_TABLET | Freq: Two times a day (BID) | ORAL | Status: DC

## 2013-02-10 NOTE — Addendum Note (Signed)
Addended by: Phebe Colla on: 02/10/2013 02:33 PM   Modules accepted: Orders

## 2013-02-11 ENCOUNTER — Telehealth: Payer: Self-pay | Admitting: Emergency Medicine

## 2013-02-11 NOTE — Telephone Encounter (Signed)
Pt is aware about his medication @ [facility].  [place]

## 2017-09-23 ENCOUNTER — Encounter (HOSPITAL_COMMUNITY): Payer: Self-pay | Admitting: *Deleted

## 2017-09-23 ENCOUNTER — Other Ambulatory Visit: Payer: Self-pay

## 2017-09-23 DIAGNOSIS — R1011 Right upper quadrant pain: Secondary | ICD-10-CM | POA: Insufficient documentation

## 2017-09-23 DIAGNOSIS — J029 Acute pharyngitis, unspecified: Secondary | ICD-10-CM | POA: Insufficient documentation

## 2017-09-23 DIAGNOSIS — Z79899 Other long term (current) drug therapy: Secondary | ICD-10-CM | POA: Insufficient documentation

## 2017-09-23 LAB — COMPREHENSIVE METABOLIC PANEL
ALK PHOS: 80 U/L (ref 38–126)
ALT: 140 U/L — AB (ref 17–63)
ANION GAP: 8 (ref 5–15)
AST: 120 U/L — ABNORMAL HIGH (ref 15–41)
Albumin: 4.3 g/dL (ref 3.5–5.0)
BILIRUBIN TOTAL: 1.2 mg/dL (ref 0.3–1.2)
BUN: 16 mg/dL (ref 6–20)
CALCIUM: 9.2 mg/dL (ref 8.9–10.3)
CO2: 24 mmol/L (ref 22–32)
CREATININE: 0.94 mg/dL (ref 0.61–1.24)
Chloride: 105 mmol/L (ref 101–111)
GFR calc Af Amer: >60 mL/min (ref >60)
GFR calc non Af Amer: >60 mL/min (ref >60)
Glucose, Bld: 128 mg/dL — ABNORMAL HIGH (ref 65–99)
Potassium: 4 mmol/L (ref 3.5–5.1)
SODIUM: 137 mmol/L (ref 135–145)
TOTAL PROTEIN: 7.5 g/dL (ref 6.5–8.1)

## 2017-09-23 LAB — CBC
HCT: 46 % (ref 39.0–52.0)
HEMOGLOBIN: 16.4 g/dL (ref 13.0–17.0)
MCH: 33.1 pg (ref 26.0–34.0)
MCHC: 35.7 g/dL (ref 30.0–36.0)
MCV: 92.7 fL (ref 78.0–100.0)
PLATELETS: 210 10*3/uL (ref 150–400)
RBC: 4.96 MIL/uL (ref 4.22–5.81)
RDW: 12 % (ref 11.5–15.5)
WBC: 7.3 10*3/uL (ref 4.0–10.5)

## 2017-09-23 LAB — LIPASE, BLOOD: Lipase: 29 U/L (ref 11–51)

## 2017-09-23 MED ORDER — FENTANYL CITRATE (PF) 100 MCG/2ML IJ SOLN
50.0000 ug | INTRAMUSCULAR | Status: DC | PRN
  Administered 2017-09-23: 50 ug via NASAL
  Filled 2017-09-23: qty 2

## 2017-09-23 NOTE — ED Triage Notes (Signed)
Pt reports sore throat since yesterday, has been taking theraflu without relief.  He woke up today with severe RUQ pain that radiates to his R flank area.  Pt reports pain is worse with movement and walking.  Denies n/v, dysuria or hematuria.  Pt has hx of RA.

## 2017-09-24 ENCOUNTER — Emergency Department (HOSPITAL_COMMUNITY)
Admission: EM | Admit: 2017-09-24 | Discharge: 2017-09-24 | Disposition: A | Discharge status: Home / Self Care | Payer: Self-pay | Attending: Emergency Medicine | Admitting: Emergency Medicine

## 2017-09-24 ENCOUNTER — Emergency Department (HOSPITAL_COMMUNITY): Payer: Self-pay

## 2017-09-24 DIAGNOSIS — R109 Unspecified abdominal pain: Secondary | ICD-10-CM

## 2017-09-24 MED ORDER — TRAMADOL HCL 50 MG PO TABS: 50.0000 mg | ORAL_TABLET | Freq: Four times a day (QID) | ORAL | 0 refills | Status: AC | PRN

## 2017-09-24 MED ORDER — NAPROXEN 500 MG PO TABS: 500.0000 mg | ORAL_TABLET | Freq: Two times a day (BID) | ORAL | 0 refills | Status: AC

## 2017-09-24 MED ORDER — KETOROLAC TROMETHAMINE 30 MG/ML IJ SOLN
30.0000 mg | Freq: Once | INTRAMUSCULAR | Status: AC
  Administered 2017-09-24: 30 mg via INTRAVENOUS
  Filled 2017-09-24: qty 1

## 2017-09-24 NOTE — Discharge Instructions (Signed)
Naproxen as prescribed.  Tramadol as prescribed as needed for pain not relieved with naproxen.  Follow-up with your primary doctor if not improving in the next week, and return to the ER if your symptoms significantly worsen or change.

## 2017-09-24 NOTE — ED Provider Notes (Signed)
[facility]-EMERGENCY DEPT Provider Note   CSN: [id number] Arrival date & time: 09/23/17  2020     History   Chief Complaint Chief Complaint  Patient presents with  . Abdominal Pain  . Sore Throat    HPI Jacob Cole is a 47 y.o. male.  Patient is a 47 year old male with past medical history of rheumatoid arthritis presenting for evaluation of right flank and right upper quadrant pain.  This started 2 nights ago the absence of any injury or trauma.  He denies any nausea, vomiting, diarrhea, or urinary complaints.  He denies any fevers or chills.  His pain is worse with movement and change of position.   The history is provided by the patient.  Abdominal Pain   This is a new problem. The current episode started 2 days ago. The problem occurs constantly. The problem has been gradually worsening. The pain is associated with an unknown factor. Pain location: Right flank. The quality of the pain is cramping. The pain is moderate. Pertinent negatives include fever, melena and dysuria. Exacerbated by: Movement and palpation. Nothing relieves the symptoms.  Sore Throat  Associated symptoms include abdominal pain.    Past Medical History:  Diagnosis Date  . Arthritis    rheumatoid    Patient Active Problem List   Diagnosis Date Noted  . Elevated LFTs 10/04/2012  . Rheumatoid arthritis(714.0) 09/23/2012  . Onychomycosis 09/23/2012    Past Surgical History:  Procedure Laterality Date  . APPENDECTOMY    . THYROGLOSSAL DUCT CYST     removed at age 74       Home Medications    Prior to Admission medications   Medication Sig Start Date End Date Taking? Authorizing Provider  folic acid (FOLVITE) 1 MG tablet Take 1 tablet (1 mg total) by mouth daily. 01/15/13   Charm Rings, MD  methotrexate (RHEUMATREX) 2.5 MG tablet Take 3 tablets (7.5 mg total) by mouth once a week. Protect from light. 02/10/13   Charm Rings, MD  naproxen (NAPROSYN) 500 MG  tablet Take 1 tablet (500 mg total) by mouth 2 (two) times daily with a meal. 02/10/13   Everrett Coombe, DO    Family History Family History  Problem Relation Age of Onset  . Hyperlipidemia Father     Social History Social History   Tobacco Use  . Smoking status: Never Smoker  . Smokeless tobacco: Never Used  Substance Use Topics  . Alcohol use: Yes    Alcohol/week: 0.6 oz    Types: 1 Cans of beer per week  . Drug use: No     Allergies   Patient has no known allergies.   Review of Systems Review of Systems  Constitutional: Negative for fever.  Gastrointestinal: Positive for abdominal pain. Negative for melena.  Genitourinary: Negative for dysuria.  All other systems reviewed and are negative.    Physical Exam Updated Vital Signs BP (!) 147/91 (BP Location: Right Arm)   Pulse (!) 102   Temp 98.3 F (36.8 C) (Oral)   Resp 16   Ht 5\' 11"  (1.803 m)   Wt 78.5 kg (173 lb)   SpO2 98%   BMI 24.13 kg/m   Physical Exam  Constitutional: He is oriented to person, place, and time. He appears well-developed and well-nourished. No distress.  HENT:  Head: Normocephalic and atraumatic.  Mouth/Throat: Oropharynx is clear and moist.  Neck: Normal range of motion. Neck supple.  Cardiovascular: Normal rate and regular rhythm. Exam  reveals no friction rub.  No murmur heard. Pulmonary/Chest: Effort normal and breath sounds normal. No respiratory distress. He has no wheezes. He has no rales.  Abdominal: Soft. Bowel sounds are normal. He exhibits no distension. There is tenderness in the right upper quadrant. There is CVA tenderness.  There is tenderness to palpation of the right upper quadrant and right flank.  Musculoskeletal: Normal range of motion. He exhibits no edema.  Neurological: He is alert and oriented to person, place, and time. Coordination normal.  Skin: Skin is warm and dry. He is not diaphoretic.  Nursing note and vitals reviewed.    ED Treatments / Results    Labs (all labs ordered are listed, but only abnormal results are displayed) Labs Reviewed  COMPREHENSIVE METABOLIC PANEL - Abnormal; Notable for the following components:      Result Value   Glucose, Bld 128 (*)    AST 120 (*)    ALT 140 (*)    All other components within normal limits  LIPASE, BLOOD  CBC  URINALYSIS, ROUTINE W REFLEX MICROSCOPIC    EKG  EKG Interpretation None       Radiology No results found.  Procedures Procedures (including critical care time)  Medications Ordered in ED Medications  fentaNYL (SUBLIMAZE) injection 50 mcg (50 mcg Nasal Given 09/23/17 2145)  ketorolac (TORADOL) 30 MG/ML injection 30 mg (not administered)     Initial Impression / Assessment and Plan / ED Course  I have reviewed the triage vital signs and the nursing notes.  Pertinent labs & imaging results that were available during my care of the patient were reviewed by me and considered in my medical decision making (see chart for details).  Patient presents with right flank pain that started two nights ago in the absence of any injury or trauma. Workup shows evidence for renal calculus. He is feeling much better after toradol.  I highly suspect a musculoskeletal etiology.  He will be given NSAIDs, tramadol, and follow-up as needed.  Final Clinical Impressions(s) / ED Diagnoses   Final diagnoses:  None    ED Discharge Orders    None       Geoffery Lyons, MD 09/24/17 [phone]

## 2017-09-24 NOTE — ED Notes (Signed)
Pt denies any urinary symptoms.

## 2017-09-26 ENCOUNTER — Emergency Department (HOSPITAL_COMMUNITY): Payer: Self-pay

## 2017-09-26 ENCOUNTER — Encounter (HOSPITAL_COMMUNITY): Payer: Self-pay | Admitting: Emergency Medicine

## 2017-09-26 ENCOUNTER — Emergency Department (HOSPITAL_COMMUNITY)
Admission: EM | Admit: 2017-09-26 | Discharge: 2017-09-26 | Disposition: A | Discharge status: Home / Self Care | Payer: Self-pay | Attending: Emergency Medicine | Admitting: Emergency Medicine

## 2017-09-26 DIAGNOSIS — R0789 Other chest pain: Secondary | ICD-10-CM | POA: Insufficient documentation

## 2017-09-26 DIAGNOSIS — M05732 Rheumatoid arthritis with rheumatoid factor of left wrist without organ or systems involvement: Secondary | ICD-10-CM | POA: Insufficient documentation

## 2017-09-26 LAB — BASIC METABOLIC PANEL
Anion gap: 10 (ref 5–15)
BUN: 15 mg/dL (ref 6–20)
CO2: 22 mmol/L (ref 22–32)
Calcium: 9.1 mg/dL (ref 8.9–10.3)
Chloride: 98 mmol/L — ABNORMAL LOW (ref 101–111)
Creatinine, Ser: 0.81 mg/dL (ref 0.61–1.24)
GFR calc Af Amer: >60 mL/min (ref >60)
GFR calc non Af Amer: >60 mL/min (ref >60)
Glucose, Bld: 117 mg/dL — ABNORMAL HIGH (ref 65–99)
Potassium: 4 mmol/L (ref 3.5–5.1)
Sodium: 130 mmol/L — ABNORMAL LOW (ref 135–145)

## 2017-09-26 LAB — CBC WITH DIFFERENTIAL/PLATELET
Basophils Absolute: 0 10*3/uL (ref 0.0–0.1)
Basophils Relative: 0 %
Eosinophils Absolute: 0 10*3/uL (ref 0.0–0.7)
Eosinophils Relative: 0 %
HCT: 43.2 % (ref 39.0–52.0)
Hemoglobin: 15.6 g/dL (ref 13.0–17.0)
Lymphocytes Relative: 11 %
Lymphs Abs: 1 10*3/uL (ref 0.7–4.0)
MCH: 33.3 pg (ref 26.0–34.0)
MCHC: 36.1 g/dL — ABNORMAL HIGH (ref 30.0–36.0)
MCV: 92.3 fL (ref 78.0–100.0)
Monocytes Absolute: 0.7 10*3/uL (ref 0.1–1.0)
Monocytes Relative: 8 %
Neutro Abs: 7.6 10*3/uL (ref 1.7–7.7)
Neutrophils Relative %: 81 %
Platelets: 223 10*3/uL (ref 150–400)
RBC: 4.68 MIL/uL (ref 4.22–5.81)
RDW: 11.9 % (ref 11.5–15.5)
WBC: 9.3 10*3/uL (ref 4.0–10.5)

## 2017-09-26 MED ORDER — IOPAMIDOL (ISOVUE-370) INJECTION 76%
100.0000 mL | Freq: Once | INTRAVENOUS | Status: AC | PRN
  Administered 2017-09-26: 100 mL via INTRAVENOUS

## 2017-09-26 MED ORDER — PREDNISONE 20 MG PO TABS
20.0000 mg | ORAL_TABLET | Freq: Once | ORAL | Status: AC
  Administered 2017-09-26: 20 mg via ORAL
  Filled 2017-09-26: qty 1

## 2017-09-26 MED ORDER — PREDNISONE 5 MG PO TABS: 5.0000 mg | ORAL_TABLET | Freq: Every day | ORAL | 0 refills | Status: AC

## 2017-09-26 MED ORDER — IOPAMIDOL (ISOVUE-370) INJECTION 76%
INTRAVENOUS | Status: AC
  Filled 2017-09-26: qty 100

## 2017-09-26 MED ORDER — OXYCODONE-ACETAMINOPHEN 5-325 MG PO TABS
1.0000 | ORAL_TABLET | Freq: Once | ORAL | Status: AC
  Administered 2017-09-26: 1 via ORAL
  Filled 2017-09-26: qty 1

## 2017-09-26 MED ORDER — OXYCODONE-ACETAMINOPHEN 5-325 MG PO TABS: 1.0000 | ORAL_TABLET | Freq: Four times a day (QID) | ORAL | 0 refills | Status: AC | PRN

## 2017-09-26 NOTE — ED Triage Notes (Signed)
Having to use [facility] for [employer].  Patient was here couple days ago fro flank pain ans was given medications. Patient reports that  RA pain due to not taking medication on time. Patient was seeing doctor in [facility] for arthritis getting medications for pain but hasnt seen them in year and requesting to have pain management set up here for his arthritis. Left wrist is very inflamed/swollen and having rib pain and getting spasms in back.

## 2017-09-26 NOTE — ED Notes (Signed)
ED Provider at bedside. 

## 2017-09-26 NOTE — Discharge Instructions (Signed)
Please read attached information. If you experience any new or worsening signs or symptoms please return to the emergency room for evaluation. Please follow-up with your primary care provider or specialist as discussed. Please use medication prescribed only as directed and discontinue taking if you have any concerning signs or symptoms.   °

## 2017-09-26 NOTE — ED Provider Notes (Signed)
[facility]   CSN: [id number] Arrival date & time: 09/26/17  1441     History   Chief Complaint Chief Complaint  Patient presents with  . pain management  . Arthritis  . Medication Refill    HPI Jacob Cole is a 47 y.o. male.  HPI   47 year old male presents today with complaints of chest pain and joint swelling.  Patient has a significant past medical history of rheumatoid arthritis.  He was recently on methotrexate but has not been taking it.  He recently started back 2 days ago after a visit to the emergency room.  Patient notes that he was seen 2 days ago for upper abdomen and rib pain, he had a CT scan with no acute findings and reassuring laboratory analysis.  Patient notes since then he has had worsening swelling in his left wrist typical of his rheumatoid arthritis.  He notes taking naproxen at home without significant improvement in his symptoms.  Patient notes that prednisone usually improves his symptoms.  He attempted to call his rheumatologist but was unable to make contact with him.  Patient notes that he has had ongoing pain in his left lower ribs and chest.  He notes this is sharp in nature coming and going worse with moving, worse with deep inspiration.  Patient notes he feels short of breath in addition to the sharp pain.  Patient denies any significant abdominal pain at this time.  Patient denies any history of DVT or PE, denies any significant risk factors.  No lower extremity swelling or edema.  Patient reports that he feels hot from time to time, no objective fever.       Past Medical History:  Diagnosis Date  . Arthritis    rheumatoid    Patient Active Problem List   Diagnosis Date Noted  . Elevated LFTs 10/04/2012  . Rheumatoid arthritis(714.0) 09/23/2012  . Onychomycosis 09/23/2012    Past Surgical History:  Procedure Laterality Date  . APPENDECTOMY    . THYROGLOSSAL DUCT CYST     removed at age 48       Home Medications    Prior to Admission medications   Medication Sig Start Date End Date Taking? Authorizing Provider  folic acid (FOLVITE) 1 MG tablet Take 1 tablet (1 mg total) by mouth daily. Patient not taking: Reported on 09/24/2017 01/15/13   Charm Rings, MD  methotrexate (RHEUMATREX) 2.5 MG tablet Take 3 tablets (7.5 mg total) by mouth once a week. Protect from light. Patient not taking: Reported on 09/24/2017 02/10/13   Charm Rings, MD  naproxen (NAPROSYN) 500 MG tablet Take 1 tablet (500 mg total) by mouth 2 (two) times daily. 09/24/17   Geoffery Lyons, MD  oxyCODONE-acetaminophen (PERCOCET/ROXICET) 5-325 MG tablet Take 1 tablet by mouth every 6 (six) hours as needed for severe pain. 09/26/17   Gurfateh Mcclain, Tinnie Gens, PA-C  predniSONE (DELTASONE) 5 MG tablet Take 1 tablet (5 mg total) by mouth daily with breakfast. 09/26/17   Pola Furno, Tinnie Gens, PA-C  traMADol (ULTRAM) 50 MG tablet Take 1 tablet (50 mg total) by mouth every 6 (six) hours as needed. 09/24/17   Geoffery Lyons, MD    Family History Family History  Problem Relation Age of Onset  . Hyperlipidemia Father     Social History Social History   Tobacco Use  . Smoking status: Never Smoker  . Smokeless tobacco: Never Used  Substance Use Topics  . Alcohol use: Yes  Alcohol/week: 0.6 oz    Types: 1 Cans of beer per week  . Drug use: No     Allergies   Patient has no known allergies.   Review of Systems Review of Systems  All other systems reviewed and are negative.    Physical Exam Updated Vital Signs BP (!) 150/89 (BP Location: Right Arm)   Pulse (!) 104   Temp 97.8 F (36.6 C) (Oral)   Resp 16   SpO2 100%   Physical Exam  Constitutional: He is oriented to person, place, and time. He appears well-developed and well-nourished.  HENT:  Head: Normocephalic and atraumatic.  Eyes: Conjunctivae are normal. Pupils are equal, round, and reactive to light. Right eye exhibits no discharge. Left eye  exhibits no discharge. No scleral icterus.  Neck: Normal range of motion. No JVD present. No tracheal deviation present.  Pulmonary/Chest: Effort normal and breath sounds normal. No stridor. No respiratory distress. He has no wheezes. He has no rales. He exhibits no tenderness.  No pain with AP lateral compression of the ribs  Abdominal: Soft. He exhibits no distension and no mass. There is no tenderness. There is no rebound and no guarding. No hernia.  Musculoskeletal: He exhibits no edema.  Minor swelling noted to the left wrist, no redness noted, slight warmth to touch, decreased range of motion of the wrist due to discomfort, sensation in the fingers intact  Neurological: He is alert and oriented to person, place, and time. Coordination normal.  Skin: Skin is warm.  Psychiatric: He has a normal mood and affect. His behavior is normal. Judgment and thought content normal.  Nursing note and vitals reviewed.    ED Treatments / Results  Labs (all labs ordered are listed, but only abnormal results are displayed) Labs Reviewed  CBC WITH DIFFERENTIAL/PLATELET - Abnormal; Notable for the following components:      Result Value   MCHC 36.1 (*)    All other components within normal limits  BASIC METABOLIC PANEL - Abnormal; Notable for the following components:   Sodium 130 (*)    Chloride 98 (*)    Glucose, Bld 117 (*)    All other components within normal limits    EKG  EKG Interpretation None       Radiology Ct Angio Chest Pe W And/or Wo Contrast  Result Date: 09/26/2017 CLINICAL DATA:  Severe right upper quadrant abdominal pain radiating to the right flank. History of rheumatoid arthritis. EXAM: CT ANGIOGRAPHY CHEST WITH CONTRAST TECHNIQUE: Multidetector CT imaging of the chest was performed using the standard protocol during bolus administration of intravenous contrast. Multiplanar CT image reconstructions and MIPs were obtained to evaluate the vascular anatomy. CONTRAST:   ISOVUE-370 IOPAMIDOL (ISOVUE-370) INJECTION 76% COMPARISON:  Chest radiographs dated 09/24/2017. FINDINGS: Cardiovascular: Satisfactory opacification of the pulmonary arteries to the segmental level. No evidence of pulmonary embolism. Normal heart size. No pericardial effusion. Mediastinum/Nodes: No enlarged mediastinal, hilar, or axillary lymph nodes. Thyroid gland, trachea, and esophagus demonstrate no significant findings. Lungs/Pleura: Small right pleural effusion. Bibasilar atelectasis, greater on the right. Upper Abdomen: Unremarkable. Musculoskeletal: Mild thoracic spine degenerative changes. Review of the MIP images confirms the above findings. IMPRESSION: 1. No pulmonary emboli. 2. Small right pleural effusion. 3. Bibasilar atelectasis, greater on the right. Electronically Signed   By: Beckie Salts M.D.   On: 09/26/2017 18:40    Procedures Procedures (including critical care time)  Medications Ordered in ED Medications  iopamidol (ISOVUE-370) 76 % injection (not administered)  oxyCODONE-acetaminophen (PERCOCET/ROXICET) 5-325 MG per tablet 1 tablet (1 tablet Oral Given 09/26/17 1743)  iopamidol (ISOVUE-370) 76 % injection 100 mL (100 mLs Intravenous Contrast Given 09/26/17 1816)  predniSONE (DELTASONE) tablet 20 mg (20 mg Oral Given 09/26/17 1934)     Initial Impression / Assessment and Plan / ED Course  I have reviewed the triage vital signs and the nursing notes.  Pertinent labs & imaging results that were available during my care of the patient were reviewed by me and considered in my medical decision making (see chart for details).     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  Patient complaining of right anterior chest wall and rib pain.  This does have a pleuritic component to it with reported  shortness of breath.  This has been ongoing and patient is concerned about this.  Due to his complaints of shortness of breath and the nature of his complaint CT angios was performed to rule out any sort of consolidation or pulmonary embolism.  No significant findings other than a very small pleural effusion were noted.  The patient was given pain medication here which significantly improved his symptoms.  Patient also here with swelling to the left wrist consistent with rheumatoid arthritis.  This is identical to previous exacerbations.  He recently started methotrexate again, and feels that he would benefit from prednisone.  I find this reasonable as he has been on this in the past.  Per chart review he was taking 5 mg daily, he will be given a dose of 20 mg here followed by 5 mg daily along with some pain medicine.  He will follow-up with rheumatologist on Monday, he will return immediately with any new or worsening signs or symptoms.  His wrist does not appear to be septic as this is typical for him, he will follow closely and return with any concerns.  Patient had no further questions or concerns at the time of discharge.  ED Discharge Orders        Ordered    predniSONE (DELTASONE) 5 MG tablet  Daily with breakfast     09/26/17 1905    oxyCODONE-acetaminophen (PERCOCET/ROXICET) 5-325 MG tablet  Every 6 hours PRN     09/26/17 1906       Rosalio Loud 09/26/17 2214    Loren Racer, MD 09/26/17 2224

## 2017-10-12 ENCOUNTER — Emergency Department (HOSPITAL_COMMUNITY)
Admission: EM | Admit: 2017-10-12 | Discharge: 2017-10-12 | Disposition: A | Discharge status: Home / Self Care | Payer: Self-pay | Attending: Emergency Medicine | Admitting: Emergency Medicine

## 2017-10-12 ENCOUNTER — Encounter (HOSPITAL_COMMUNITY): Payer: Self-pay | Admitting: Emergency Medicine

## 2017-10-12 DIAGNOSIS — M549 Dorsalgia, unspecified: Secondary | ICD-10-CM | POA: Insufficient documentation

## 2017-10-12 DIAGNOSIS — M069 Rheumatoid arthritis, unspecified: Secondary | ICD-10-CM | POA: Insufficient documentation

## 2017-10-12 DIAGNOSIS — Z79899 Other long term (current) drug therapy: Secondary | ICD-10-CM | POA: Insufficient documentation

## 2017-10-12 LAB — COMPREHENSIVE METABOLIC PANEL
ALK PHOS: 120 U/L (ref 38–126)
ALT: 121 U/L — ABNORMAL HIGH (ref 17–63)
AST: 41 U/L (ref 15–41)
Albumin: 2.6 g/dL — ABNORMAL LOW (ref 3.5–5.0)
Anion gap: 9 (ref 5–15)
BILIRUBIN TOTAL: 0.8 mg/dL (ref 0.3–1.2)
BUN: 19 mg/dL (ref 6–20)
CALCIUM: 8.8 mg/dL — AB (ref 8.9–10.3)
CO2: 23 mmol/L (ref 22–32)
Chloride: 103 mmol/L (ref 101–111)
Creatinine, Ser: 0.72 mg/dL (ref 0.61–1.24)
GFR calc Af Amer: >60 mL/min (ref >60)
GFR calc non Af Amer: >60 mL/min (ref >60)
Glucose, Bld: 100 mg/dL — ABNORMAL HIGH (ref 65–99)
POTASSIUM: 3.2 mmol/L — AB (ref 3.5–5.1)
Sodium: 135 mmol/L (ref 135–145)
TOTAL PROTEIN: 7 g/dL (ref 6.5–8.1)

## 2017-10-12 LAB — CBC WITH DIFFERENTIAL/PLATELET
BASOS ABS: 0 10*3/uL (ref 0.0–0.1)
Basophils Relative: 0 %
Eosinophils Absolute: 0 10*3/uL (ref 0.0–0.7)
Eosinophils Relative: 0 %
HEMATOCRIT: 34.7 % — AB (ref 39.0–52.0)
Hemoglobin: 12.1 g/dL — ABNORMAL LOW (ref 13.0–17.0)
LYMPHS PCT: 7 %
Lymphs Abs: 1.5 10*3/uL (ref 0.7–4.0)
MCH: 33.2 pg (ref 26.0–34.0)
MCHC: 34.9 g/dL (ref 30.0–36.0)
MCV: 95.1 fL (ref 78.0–100.0)
MONO ABS: 0.7 10*3/uL (ref 0.1–1.0)
MONOS PCT: 3 %
NEUTROS ABS: 17.8 10*3/uL — AB (ref 1.7–7.7)
Neutrophils Relative %: 90 %
Platelets: 603 10*3/uL — ABNORMAL HIGH (ref 150–400)
RBC: 3.65 MIL/uL — ABNORMAL LOW (ref 4.22–5.81)
RDW: 12.1 % (ref 11.5–15.5)
WBC: 19.9 10*3/uL — ABNORMAL HIGH (ref 4.0–10.5)

## 2017-10-12 MED ORDER — SODIUM CHLORIDE 0.9 % IV BOLUS (SEPSIS)
1000.0000 mL | Freq: Once | INTRAVENOUS | Status: AC
  Administered 2017-10-12: 1000 mL via INTRAVENOUS

## 2017-10-12 MED ORDER — MORPHINE SULFATE (PF) 4 MG/ML IV SOLN
4.0000 mg | Freq: Once | INTRAVENOUS | Status: AC
  Administered 2017-10-12: 4 mg via INTRAVENOUS
  Filled 2017-10-12: qty 1

## 2017-10-12 MED ORDER — KETOROLAC TROMETHAMINE 30 MG/ML IJ SOLN
15.0000 mg | Freq: Once | INTRAMUSCULAR | Status: AC
  Administered 2017-10-12: 15 mg via INTRAVENOUS
  Filled 2017-10-12: qty 1

## 2017-10-12 MED ORDER — OXYCODONE-ACETAMINOPHEN 5-325 MG PO TABS: 2.0000 | ORAL_TABLET | ORAL | 0 refills | Status: AC | PRN

## 2017-10-12 MED ORDER — SODIUM CHLORIDE 0.9 % IV SOLN
INTRAVENOUS | Status: DC
  Administered 2017-10-12: 13:00:00 via INTRAVENOUS

## 2017-10-12 MED ORDER — METHYLPREDNISOLONE SODIUM SUCC 125 MG IJ SOLR
125.0000 mg | Freq: Once | INTRAMUSCULAR | Status: AC
  Administered 2017-10-12: 125 mg via INTRAVENOUS
  Filled 2017-10-12: qty 2

## 2017-10-12 NOTE — ED Provider Notes (Signed)
[facility]-EMERG[facility]Y DEPT Provider Note   CSN: [id number] Arrival date & time: 10/12/17  1006     History   Chief Complaint Chief Complaint  Patient presents with  . Back Pain    HPI Jacob Cole is a 47 y.o. male.  47 year old male with history of rheumatoid arthritis presents with increase pain to his back and extremities.  Was seen by his rheumatologist at [facility] a few days ago and his prednisone dose was adjusted.  The plan was that this did not work he was to start of therapy.  Patient denies any fever or chills.  No loss of bowel or bladder function.  Pain is sharp and worse with any ambulation and better with remaining still.  He has been unresponsive to his current regimen      Past Medical History:  Diagnosis Date  . Arthritis    rheumatoid    Patient Active Problem List   Diagnosis Date Noted  . Elevated LFTs 10/04/2012  . Rheumatoid arthritis(714.0) 09/23/2012  . Onychomycosis 09/23/2012    Past Surgical History:  Procedure Laterality Date  . APPENDECTOMY    . THYROGLOSSAL DUCT CYST     removed at age 79       Home Medications    Prior to Admission medications   Medication Sig Start Date End Date Taking? Authorizing Provider  folic acid (FOLVITE) 1 MG tablet Take 1 tablet (1 mg total) by mouth daily. Patient not taking: Reported on 09/24/2017 01/15/13   Charm Rings, MD  methotrexate (RHEUMATREX) 2.5 MG tablet Take 3 tablets (7.5 mg total) by mouth once a week. Protect from light. Patient not taking: Reported on 09/24/2017 02/10/13   Charm Rings, MD  naproxen (NAPROSYN) 500 MG tablet Take 1 tablet (500 mg total) by mouth 2 (two) times daily. 09/24/17   Geoffery Lyons, MD  oxyCODONE-acetaminophen (PERCOCET/ROXICET) 5-325 MG tablet Take 1 tablet by mouth every 6 (six) hours as needed for severe pain. 09/26/17   Hedges, Tinnie Gens, PA-C  predniSONE (DELTASONE) 5 MG tablet Take 1 tablet (5 mg total) by mouth daily with breakfast.  09/26/17   Hedges, Tinnie Gens, PA-C  traMADol (ULTRAM) 50 MG tablet Take 1 tablet (50 mg total) by mouth every 6 (six) hours as needed. 09/24/17   Geoffery Lyons, MD    Family History Family History  Problem Relation Age of Onset  . Hyperlipidemia Father     Social History Social History   Tobacco Use  . Smoking status: Never Smoker  . Smokeless tobacco: Never Used  Substance Use Topics  . Alcohol use: Yes    Alcohol/week: 0.6 oz    Types: 1 Cans of beer per week  . Drug use: No     Allergies   Patient has no known allergies.   Review of Systems Review of Systems  All other systems reviewed and are negative.    Physical Exam Updated Vital Signs BP 113/76 (BP Location: Left Arm)   Pulse 96   Temp 98.5 F (36.9 C) (Oral)   Resp 18   SpO2 99%   Physical Exam  Constitutional: He is oriented to person, place, and time. He appears well-developed and well-nourished.  Non-toxic appearance. No distress.  HENT:  Head: Normocephalic and atraumatic.  Eyes: Conjunctivae, EOM and lids are normal. Pupils are equal, round, and reactive to light.  Neck: Normal range of motion. Neck supple. No tracheal deviation present. No thyroid mass present.  Cardiovascular: Normal rate, regular rhythm and  normal heart sounds. Exam reveals no gallop.  No murmur heard. Pulmonary/Chest: Effort normal and breath sounds normal. No stridor. No respiratory distress. He has no decreased breath sounds. He has no wheezes. He has no rhonchi. He has no rales.  Abdominal: Soft. Normal appearance and bowel sounds are normal. He exhibits no distension. There is no tenderness. There is no rebound and no CVA tenderness.  Musculoskeletal: He exhibits no edema or tenderness.       Left knee: He exhibits decreased range of motion. He exhibits no swelling and no effusion.       Back:  Neurological: He is alert and oriented to person, place, and time. He has normal strength. No cranial nerve deficit or sensory  deficit. GCS eye subscore is 4. GCS verbal subscore is 5. GCS motor subscore is 6.  Skin: Skin is warm and dry. No abrasion and no rash noted.  Psychiatric: He has a normal mood and affect. His speech is normal and behavior is normal.  Nursing note and vitals reviewed.    ED Treatments / Results  Labs (all labs ordered are listed, but only abnormal results are displayed) Labs Reviewed  CBC WITH DIFFERENTIAL/PLATELET  COMPREHENSIVE METABOLIC PANEL    EKG  EKG Interpretation None       Radiology No results found.  Procedures Procedures (including critical care time)  Medications Ordered in ED Medications  sodium chloride 0.9 % bolus 1,000 mL (not administered)  0.9 %  sodium chloride infusion (not administered)  methylPREDNISolone sodium succinate (SOLU-MEDROL) 125 mg/2 mL injection 125 mg (not administered)  ketorolac (TORADOL) 30 MG/ML injection 15 mg (not administered)  morphine 4 MG/ML injection 4 mg (not administered)     Initial Impression / Assessment and Plan / ED Course  I have reviewed the triage vital signs and the nursing notes.  Pertinent labs & imaging results that were available during my care of the patient were reviewed by me and considered in my medical decision making (see chart for details).     Spanish interpreter used during this interaction.  Patient treated for pain with steroids and Toradol as well as opiates and feels much better.  Will place on short course of Percocet and he will follow-up with his doctor at [facility]  Final Clinical Impressions(s) / ED Diagnoses   Final diagnoses:  None    ED Discharge Orders    None       Lorre Nick, MD 10/12/17 1400

## 2017-10-12 NOTE — ED Notes (Signed)
Bed: KZ60 Expected date: 10/12/17 Expected time: 10:02 AM Means of arrival: Ambulance Comments: Back pain, bed bound

## 2017-10-12 NOTE — ED Triage Notes (Signed)
Patient brought in by EMS with complaints of lower back pain and spasms. Hx of RA. Reports that he is having a "flare". Speaks Spanish.

## 2017-10-18 ENCOUNTER — Other Ambulatory Visit: Payer: Self-pay

## 2017-10-18 ENCOUNTER — Emergency Department: Payer: Self-pay

## 2017-10-18 ENCOUNTER — Encounter: Payer: Self-pay | Admitting: Emergency Medicine

## 2017-10-18 ENCOUNTER — Emergency Department
Admission: EM | Admit: 2017-10-18 | Discharge: 2017-10-19 | Payer: Self-pay | Attending: Emergency Medicine | Admitting: Emergency Medicine

## 2017-10-18 DIAGNOSIS — R339 Retention of urine, unspecified: Secondary | ICD-10-CM | POA: Insufficient documentation

## 2017-10-18 DIAGNOSIS — L02416 Cutaneous abscess of left lower limb: Secondary | ICD-10-CM | POA: Insufficient documentation

## 2017-10-18 DIAGNOSIS — R1032 Left lower quadrant pain: Secondary | ICD-10-CM | POA: Insufficient documentation

## 2017-10-18 DIAGNOSIS — R531 Weakness: Secondary | ICD-10-CM | POA: Insufficient documentation

## 2017-10-18 DIAGNOSIS — G834 Cauda equina syndrome: Secondary | ICD-10-CM | POA: Insufficient documentation

## 2017-10-18 DIAGNOSIS — G8929 Other chronic pain: Secondary | ICD-10-CM | POA: Insufficient documentation

## 2017-10-18 DIAGNOSIS — M069 Rheumatoid arthritis, unspecified: Secondary | ICD-10-CM | POA: Insufficient documentation

## 2017-10-18 DIAGNOSIS — R6883 Chills (without fever): Secondary | ICD-10-CM | POA: Insufficient documentation

## 2017-10-18 LAB — COMPREHENSIVE METABOLIC PANEL
ALBUMIN: 2.6 g/dL — AB (ref 3.5–5.0)
ALK PHOS: 271 U/L — AB (ref 38–126)
ALT: 176 U/L — ABNORMAL HIGH (ref 17–63)
ANION GAP: 13 (ref 5–15)
AST: 85 U/L — ABNORMAL HIGH (ref 15–41)
BILIRUBIN TOTAL: 0.6 mg/dL (ref 0.3–1.2)
BUN: 25 mg/dL — ABNORMAL HIGH (ref 6–20)
CALCIUM: 9 mg/dL (ref 8.9–10.3)
CO2: 19 mmol/L — ABNORMAL LOW (ref 22–32)
Chloride: 97 mmol/L — ABNORMAL LOW (ref 101–111)
Creatinine, Ser: 0.85 mg/dL (ref 0.61–1.24)
GFR calc Af Amer: >60 mL/min (ref >60)
GFR calc non Af Amer: >60 mL/min (ref >60)
GLUCOSE: 205 mg/dL — AB (ref 65–99)
POTASSIUM: 4.1 mmol/L (ref 3.5–5.1)
Sodium: 129 mmol/L — ABNORMAL LOW (ref 135–145)
TOTAL PROTEIN: 8.2 g/dL — AB (ref 6.5–8.1)

## 2017-10-18 LAB — CBC WITH DIFFERENTIAL/PLATELET
BASOS PCT: 0 %
Basophils Absolute: 0 10*3/uL (ref 0–0.1)
EOS ABS: 0 10*3/uL (ref 0–0.7)
Eosinophils Relative: 0 %
HEMATOCRIT: 39.7 % — AB (ref 40.0–52.0)
HEMOGLOBIN: 13.6 g/dL (ref 13.0–18.0)
LYMPHS ABS: 0.7 10*3/uL — AB (ref 1.0–3.6)
Lymphocytes Relative: 4 %
MCH: 32 pg (ref 26.0–34.0)
MCHC: 34.3 g/dL (ref 32.0–36.0)
MCV: 93.2 fL (ref 80.0–100.0)
MONO ABS: 0.6 10*3/uL (ref 0.2–1.0)
Monocytes Relative: 3 %
NEUTROS ABS: 15.9 10*3/uL — AB (ref 1.4–6.5)
Neutrophils Relative %: 93 %
Platelets: 386 10*3/uL (ref 150–440)
RBC: 4.26 MIL/uL — ABNORMAL LOW (ref 4.40–5.90)
RDW: 13 % (ref 11.5–14.5)
WBC: 17.3 10*3/uL — ABNORMAL HIGH (ref 3.8–10.6)

## 2017-10-18 LAB — URINALYSIS, COMPLETE (UACMP) WITH MICROSCOPIC
Bilirubin Urine: NEGATIVE
Glucose, UA: NEGATIVE mg/dL
KETONES UR: NEGATIVE mg/dL
LEUKOCYTES UA: NEGATIVE
NITRITE: POSITIVE — AB
PROTEIN: NEGATIVE mg/dL
Specific Gravity, Urine: 1.013 (ref 1.005–1.030)
pH: 6 (ref 5.0–8.0)

## 2017-10-18 LAB — LACTIC ACID, PLASMA: Lactic Acid, Venous: 1.8 mmol/L (ref 0.5–1.9)

## 2017-10-18 LAB — SEDIMENTATION RATE: SED RATE: 92 mm/h — AB (ref 0–15)

## 2017-10-18 MED ORDER — SODIUM CHLORIDE 0.9 % IV BOLUS (SEPSIS)
1000.0000 mL | Freq: Once | INTRAVENOUS | Status: AC
  Administered 2017-10-18: 1000 mL via INTRAVENOUS

## 2017-10-18 MED ORDER — GADOBENATE DIMEGLUMINE 529 MG/ML IV SOLN
15.0000 mL | Freq: Once | INTRAVENOUS | Status: AC | PRN
  Administered 2017-10-18: 15 mL via INTRAVENOUS

## 2017-10-18 MED ORDER — VANCOMYCIN HCL IN DEXTROSE 1-5 GM/200ML-% IV SOLN
1000.0000 mg | Freq: Once | INTRAVENOUS | Status: AC
  Administered 2017-10-18: 1000 mg via INTRAVENOUS
  Filled 2017-10-18: qty 200

## 2017-10-18 MED ORDER — HYDROMORPHONE HCL 1 MG/ML IJ SOLN
1.0000 mg | Freq: Once | INTRAMUSCULAR | Status: AC
  Administered 2017-10-18: 1 mg via INTRAVENOUS
  Filled 2017-10-18: qty 1

## 2017-10-18 MED ORDER — PIPERACILLIN-TAZOBACTAM 3.375 G IVPB 30 MIN
3.3750 g | Freq: Once | INTRAVENOUS | Status: AC
  Administered 2017-10-18: 3.375 g via INTRAVENOUS
  Filled 2017-10-18: qty 50

## 2017-10-18 MED ORDER — DEXAMETHASONE SODIUM PHOSPHATE 10 MG/ML IJ SOLN
10.0000 mg | Freq: Once | INTRAMUSCULAR | Status: AC
  Administered 2017-10-18: 10 mg via INTRAVENOUS
  Filled 2017-10-18: qty 1

## 2017-10-18 NOTE — ED Notes (Signed)
ED Provider at bedside. 

## 2017-10-18 NOTE — ED Notes (Signed)
Pt has history of arthritis and has difficulty moving, uses a wheelchair at home and family assisted him to the stretcher. Dr. Mayford Knife at bedside, pt has a ? Sore behind his left knee that has been draining with blood and pus, MD collected culture from wound

## 2017-10-18 NOTE — ED Triage Notes (Signed)
States had abscess L popliteal that opened and drained 2 days ago. States pain with urination x 3 days. States has rheumatoid arthritis and has been in bed due to generalized pain.

## 2017-10-18 NOTE — ED Notes (Signed)
Called MRI tech [phone], is on his way in

## 2017-10-18 NOTE — ED Notes (Signed)
Patient transported to MRI 

## 2017-10-18 NOTE — ED Provider Notes (Signed)
Patient has been accepted to [facility] as an ER to ER transfer.   Emily Filbert, MD 10/18/17 2342

## 2017-10-18 NOTE — ED Provider Notes (Addendum)
[facility] Emergency Department Provider Note       Time seen: ----------------------------------------- 6:39 PM on 10/18/2017 -----------------------------------------   I have reviewed the triage vital signs and the nursing notes.  HISTORY   Chief Complaint Abscess    HPI Jacob Cole is a 47 y.o. male with a history of rheumatoid arthritis who presents to the ED for multiple complaints.  He has had ongoing low back pain and has significant pain from his rheumatoid arthritis.  He also recently had an abscess form in his left popliteal fossa that opened up and drained 2 days ago.  He states pain with urination has been persistent for the past 3 days as well and now he low back pain and was treated with steroids and pain medicine for rheumatoid arthritis.  He has had chills but denies fevers.  He is also having pain in his left inguinal area.  Patient states he can no longer walk  Past Medical History:  Diagnosis Date  . Arthritis    rheumatoid    Patient Active Problem List   Diagnosis Date Noted  . Elevated LFTs 10/04/2012  . Rheumatoid arthritis(714.0) 09/23/2012  . Onychomycosis 09/23/2012    Past Surgical History:  Procedure Laterality Date  . APPENDECTOMY    . THYROGLOSSAL DUCT CYST     removed at age 68    Allergies Patient has no known allergies.  Social History Social History   Tobacco Use  . Smoking status: Never Smoker  . Smokeless tobacco: Never Used  Substance Use Topics  . Alcohol use: Yes    Alcohol/week: 0.6 oz    Types: 1 Cans of beer per week  . Drug use: No    Review of Systems Constitutional: Negative for fever.  Positive for chills Cardiovascular: Negative for chest pain. Respiratory: Negative for shortness of breath. Gastrointestinal: Negative for abdominal pain, vomiting and diarrhea. Genitourinary: Positive for dysuria and urinary retention Musculoskeletal: Positive for midline low back  pain Skin: Negative for rash. Neurological: Positive for profound weakness  All systems negative/normal/unremarkable except as stated in the HPI  ____________________________________________   PHYSICAL EXAM:  VITAL SIGNS: ED Triage Vitals [10/18/17 1609]  Enc Vitals Group     BP 122/87     Pulse Rate 100     Resp 20     Temp 97.9 F (36.6 C)     Temp Source Oral     SpO2 96 %     Weight 160 lb (72.6 kg)     Height 5\' 11"  (1.803 m)     Head Circumference      Peak Flow      Pain Score 10     Pain Loc      Pain Edu?      Excl. in GC?     Constitutional: Alert and oriented.  Chronically ill-appearing, no distress Eyes: Conjunctivae are normal. Normal extraocular movements. ENT   Head: Normocephalic and atraumatic.   Nose: No congestion/rhinnorhea.   Mouth/Throat: Mucous membranes are moist.   Neck: No stridor. Cardiovascular: Normal rate, regular rhythm. No murmurs, rubs, or gallops. Respiratory: Normal respiratory effort without tachypnea nor retractions. Breath sounds are clear and equal bilaterally. No wheezes/rales/rhonchi. Gastrointestinal: Soft and nontender. Normal bowel sounds Musculoskeletal: Lower externally weakness, small hematoma and possible recent abscess in the left popliteal fossa.  Midline low back spinal tenderness. Neurologic:  Normal speech and language. No gross focal neurologic deficits are appreciated.  Skin:  Skin is warm, dry with  small hematoma and possibly recent abscess behind his left knee.  Petechial lesions are appreciated on his lower extremities Psychiatric: Mood and affect are normal. Speech and behavior are normal.  ____________________________________________  ED COURSE:  As part of my medical decision making, I reviewed the following data within the electronic MEDICAL RECORD NUMBER History obtained from family if available, nursing notes, old chart and ekg, as well as notes from prior ED visits. Patient presented for  weakness, urinary retention and low back pain with recent abscess formation, we will assess with labs and imaging as indicated at this time.  Clinical Course as of Oct 18 2102  Sat Oct 18, 2017  2029 916 of urine on bladder scan.  Foley catheter was placed with success.  [JW]    Clinical Course User Index [JW] Emily Filbert, MD   Procedures ____________________________________________   LABS (pertinent positives/negatives)  Labs Reviewed  CBC WITH DIFFERENTIAL/PLATELET - Abnormal; Notable for the following components:      Result Value   WBC 17.3 (*)    RBC 4.26 (*)    HCT 39.7 (*)    Neutro Abs 15.9 (*)    Lymphs Abs 0.7 (*)    All other components within normal limits  COMPREHENSIVE METABOLIC PANEL - Abnormal; Notable for the following components:   Sodium 129 (*)    Chloride 97 (*)    CO2 19 (*)    Glucose, Bld 205 (*)    BUN 25 (*)    Total Protein 8.2 (*)    Albumin 2.6 (*)    AST 85 (*)    ALT 176 (*)    Alkaline Phosphatase 271 (*)    All other components within normal limits  URINALYSIS, COMPLETE (UACMP) WITH MICROSCOPIC - Abnormal; Notable for the following components:   Color, Urine YELLOW (*)    APPearance CLEAR (*)    Hgb urine dipstick SMALL (*)    Nitrite POSITIVE (*)    Bacteria, UA FEW (*)    Squamous Epithelial / LPF 0-5 (*)    All other components within normal limits  SEDIMENTATION RATE - Abnormal; Notable for the following components:   Sed Rate 92 (*)    All other components within normal limits  URINE CULTURE  CULTURE, BLOOD (ROUTINE X 2)  CULTURE, BLOOD (ROUTINE X 2)  AEROBIC/ANAEROBIC CULTURE (SURGICAL/DEEP WOUND)  LACTIC ACID, PLASMA   CRITICAL CARE Performed by: Ulice Dash   Total critical care time: 30 minutes  Critical care time was exclusive of separately billable procedures and treating other patients.  Critical care was necessary to treat or prevent imminent or life-threatening deterioration.  Critical care  was time spent personally by me on the following activities: development of treatment plan with patient and/or surrogate as well as nursing, discussions with consultants, evaluation of patient's response to treatment, examination of patient, obtaining history from patient or surrogate, ordering and performing treatments and interventions, ordering and review of laboratory studies, ordering and review of radiographic studies, pulse oximetry and re-evaluation of patient's condition.  RADIOLOGY Images were viewed by me  MRI lumbro-sacral spine Possible cauda equina syndrome  IMPRESSION: 1. 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 2. 10 x 9 x 15 mm L4-5 and 6 x 9 x 11 mm L5-S1 nonenhancing epidural collections favoring disc extrusions, less likely abscess. Resultant severe canal stenosis L4-5 and thecal sac effacement at L5, mild canal stenosis L5-S1. 3. LEFT greater  than RIGHT paraspinal muscle strain versus myositis. Two LEFT L4-5 paraspinal fluid collections measuring to 12 x 9 mm with appearance of facet synovial cysts, however abscess not excluded. 4. Neural foraminal narrowing L3-4 through L5-S1: Moderate to severe at L4-5 and L5-S1.  Acute findings discussed with and reconfirmed by Delila Spence on 10/18/2017 at 11:00 pm.  ____________________________________________  DIFFERENTIAL DIAGNOSIS   Epidural abscess, discitis, cellulitis, superficial abscess, rheumatoid arthritis, UTI  FINAL ASSESSMENT AND PLAN  Cauda equina syndrome   Plan: Patient had presented for a variety of symptoms including low back pain, weakness and urinary retention. Patient's labs were concerning for elevated white blood cell count and elevated sed rate.  Lactic acid level was unremarkable. Patient's imaging revealed L5-S1 extrusion and likely cauda equina syndrome given his clinical presentation.  Due to him  previously being seen at [facility], we will attempt transfer to [facility].  I will ordered broad-spectrum antibiotics to cover for any infection.   Ulice Dash, MD   Note: This note was generated in part or whole with voice recognition software. Voice recognition is usually quite accurate but there are transcription errors that can and very often do occur. I apologize for any typographical errors that were not detected and corrected.     Emily Filbert, MD 10/18/17 Jackquline Bosch    Emily Filbert, MD 10/18/17 [phone]

## 2017-10-18 NOTE — ED Notes (Signed)
Patient returned from MRI.

## 2017-10-19 LAB — BLOOD CULTURE ID PANEL (REFLEXED)
ACINETOBACTER BAUMANNII: NOT DETECTED
CANDIDA ALBICANS: NOT DETECTED
Candida glabrata: NOT DETECTED
Candida krusei: NOT DETECTED
Candida parapsilosis: NOT DETECTED
Candida tropicalis: NOT DETECTED
ENTEROBACTER CLOACAE COMPLEX: NOT DETECTED
ENTEROBACTERIACEAE SPECIES: NOT DETECTED
Enterococcus species: NOT DETECTED
Escherichia coli: NOT DETECTED
HAEMOPHILUS INFLUENZAE: NOT DETECTED
KLEBSIELLA OXYTOCA: NOT DETECTED
Klebsiella pneumoniae: NOT DETECTED
Listeria monocytogenes: NOT DETECTED
METHICILLIN RESISTANCE: NOT DETECTED
Neisseria meningitidis: NOT DETECTED
PSEUDOMONAS AERUGINOSA: NOT DETECTED
Proteus species: NOT DETECTED
STREPTOCOCCUS PNEUMONIAE: NOT DETECTED
STREPTOCOCCUS PYOGENES: NOT DETECTED
STREPTOCOCCUS SPECIES: NOT DETECTED
Serratia marcescens: NOT DETECTED
Staphylococcus aureus (BCID): DETECTED — AB
Staphylococcus species: DETECTED — AB
Streptococcus agalactiae: NOT DETECTED

## 2017-10-20 MED ORDER — GENERIC EXTERNAL MEDICATION
Status: DC
Start: ? — End: 2017-10-20

## 2017-10-20 MED ORDER — DOCUSATE SODIUM 100 MG PO CAPS
100.00 mg | ORAL_CAPSULE | ORAL | Status: DC
Start: 2017-10-20 — End: 2017-10-20

## 2017-10-20 MED ORDER — CEFEPIME HCL 2 GM/100ML IV SOLN
2.00 | INTRAVENOUS | Status: DC
Start: 2017-10-20 — End: 2017-10-20

## 2017-10-20 MED ORDER — OXYCODONE-ACETAMINOPHEN 5-325 MG PO TABS
1.00 | ORAL_TABLET | ORAL | Status: DC
Start: ? — End: 2017-10-20

## 2017-10-20 MED ORDER — MORPHINE SULFATE 4 MG/ML IJ SOLN
2.00 mg | INTRAMUSCULAR | Status: DC
Start: ? — End: 2017-10-20

## 2017-10-20 MED ORDER — ACETAMINOPHEN 325 MG PO TABS
650.00 mg | ORAL_TABLET | ORAL | Status: DC
Start: ? — End: 2017-10-20

## 2017-10-20 MED ORDER — METRONIDAZOLE IN NACL 5-0.79 MG/ML-% IV SOLN
500.00 mg | INTRAVENOUS | Status: DC
Start: 2017-10-20 — End: 2017-10-20

## 2017-10-20 MED ORDER — GENERIC EXTERNAL MEDICATION
2.00 | Status: DC
Start: ? — End: 2017-10-20

## 2017-10-20 MED ORDER — ONDANSETRON HCL 4 MG/2ML IJ SOLN
4.00 mg | INTRAMUSCULAR | Status: DC
Start: ? — End: 2017-10-20

## 2017-10-21 LAB — CULTURE, BLOOD (ROUTINE X 2)

## 2017-10-21 LAB — URINE CULTURE: SPECIAL REQUESTS: NORMAL

## 2017-10-22 NOTE — Progress Notes (Signed)
Pharmacy ED positive Culture report  Report called to [facility] where patient is currently in room 17.  Elsworth Soho RN accepted the information regarding the positive culture of urine and blood containing Staphylococcus Aureus and their respective sensitivities. She acknowledged the information should be available in "care everywhere" and she will inform physician.    Luan Pulling, PharmD, MBA, [employer] Clinical Pharmacist [facility]

## 2017-10-24 LAB — AEROBIC/ANAEROBIC CULTURE W GRAM STAIN (SURGICAL/DEEP WOUND): Special Requests: NORMAL

## 2017-10-24 LAB — AEROBIC/ANAEROBIC CULTURE (SURGICAL/DEEP WOUND)

## 2018-10-12 IMAGING — MR MR LUMBAR SPINE WO/W CM
6 of 7 series · 34 of 48 positions shown · IV contrast (multihance)
Comparison: CT abdomen and pelvis September 24, 2017

CLINICAL DATA: History of arthritis, LEFT popliteal abscess. Pain
with urination for 3 days. History of rheumatoid arthritis on
methotrexate.

EXAM:
MRI LUMBAR SPINE WITHOUT AND WITH CONTRAST
TECHNIQUE: Multiplanar and multiecho pulse sequences of the lumbar spine were
obtained without and with intravenous contrast.
CONTRAST:  15mL MULTIHANCE GADOBENATE DIMEGLUMINE 529 MG/ML IV SOLN

[Series 2: T2 · sagittal · 4.0mm · 0.88mm/px · 5 of 17 slices shown (1 of 2)]
[im 1/17]
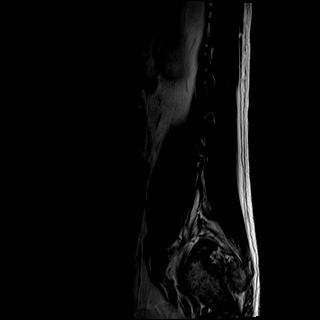
[im 5/17]
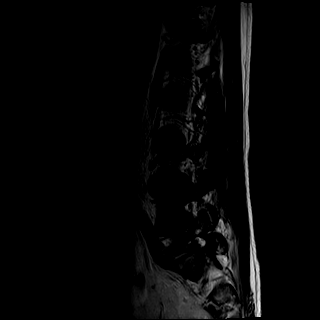
[im 9/17]
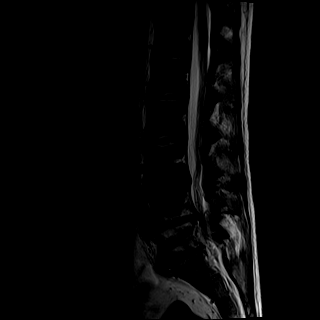
[im 13/17]
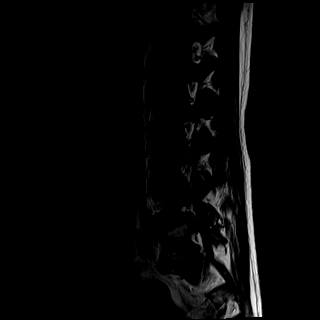
[im 17/17]
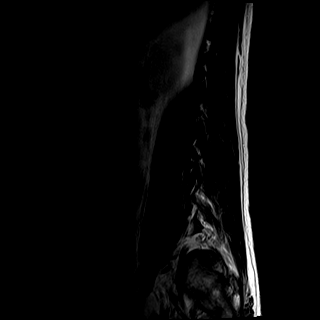

[Series 3: T1 · sagittal · 4.0mm · 0.88mm/px · 5 of 17 slices shown (1 of 2)]
[im 1/17]
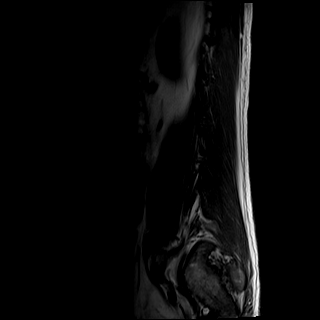
[im 5/17]
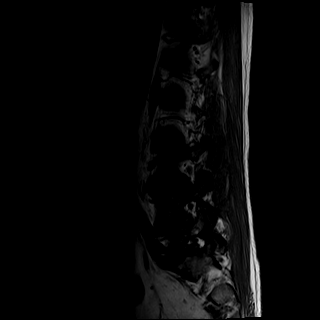
[im 9/17]
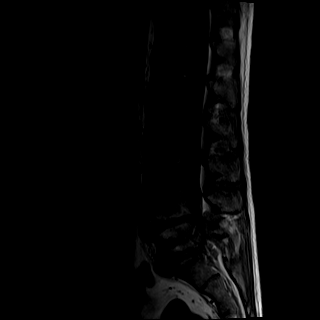
[im 13/17]
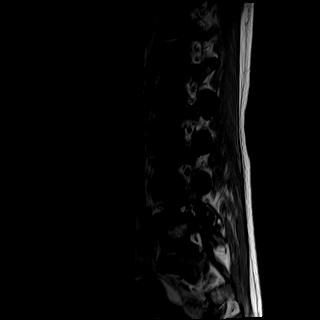
[im 17/17]
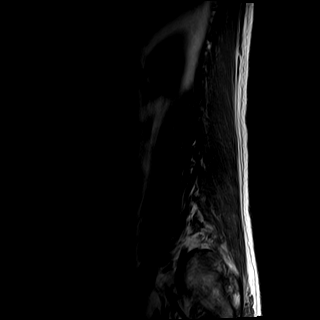

[Series 4: STIR · sagittal · 4.0mm · 1.09mm/px · 4 of 17 slices shown]
[im 1/17]
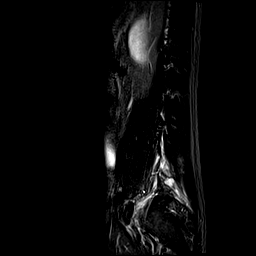
[im 6/17]
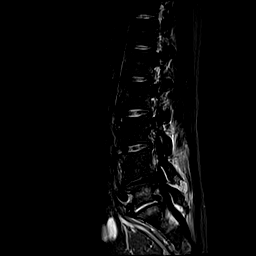
[im 11/17]
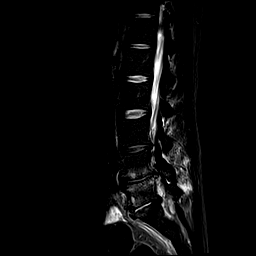
[im 17/17]
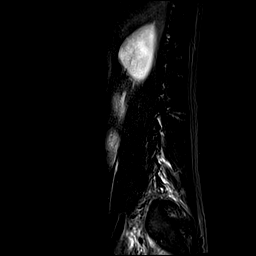

[Series 5: T2 · axial · 4.0mm · 0.78mm/px · z∈[-76,+137]mm · 8 of 40 slices shown (2 of 2)]
[im 1/40]
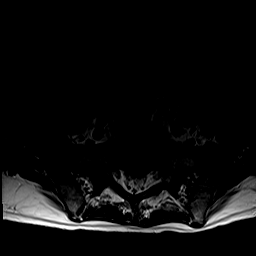
[im 5/40]
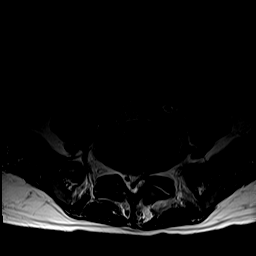
[im 14/40]
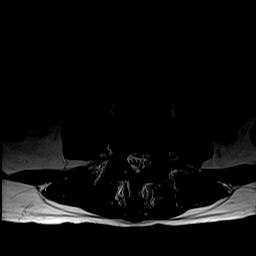
[im 18/40]
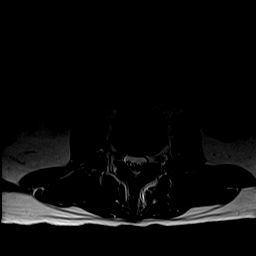
[im 22/40]
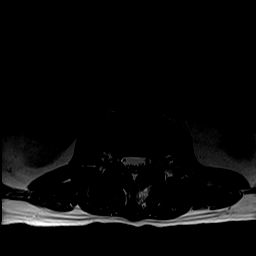
[im 27/40]
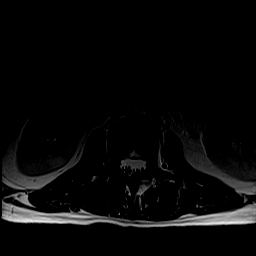
[im 35/40]
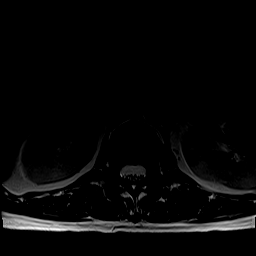
[im 40/40]
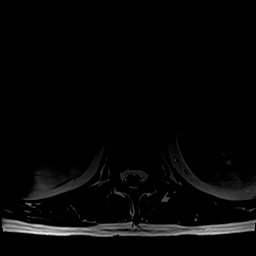

[Series 6: T1 · axial · 4.0mm · 0.39mm/px · z∈[-76,+137]mm · 8 of 40 slices shown (2 of 2)]
[im 1/40]
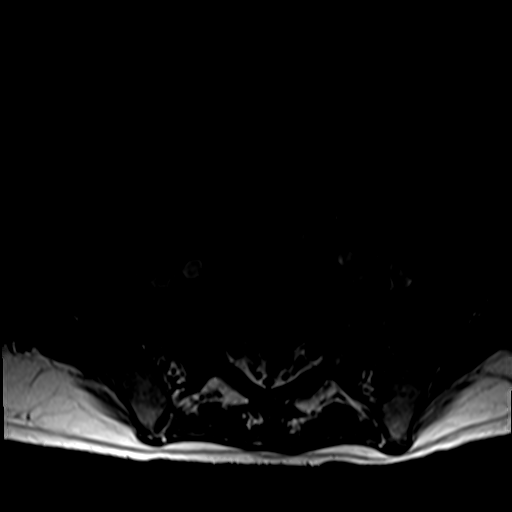
[im 5/40]
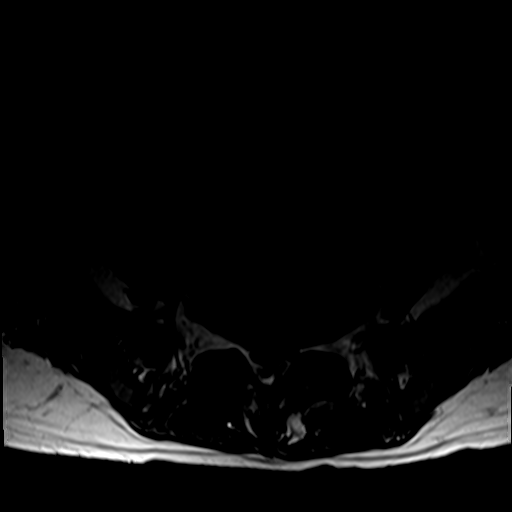
[im 14/40]
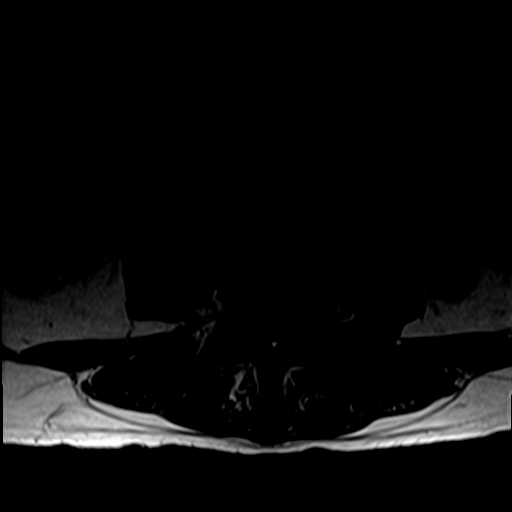
[im 18/40]
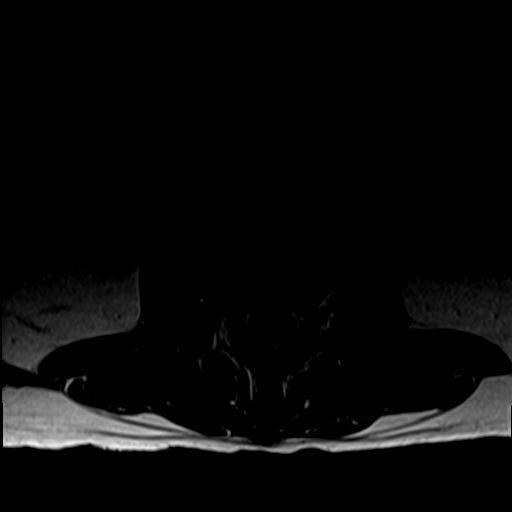
[im 22/40]
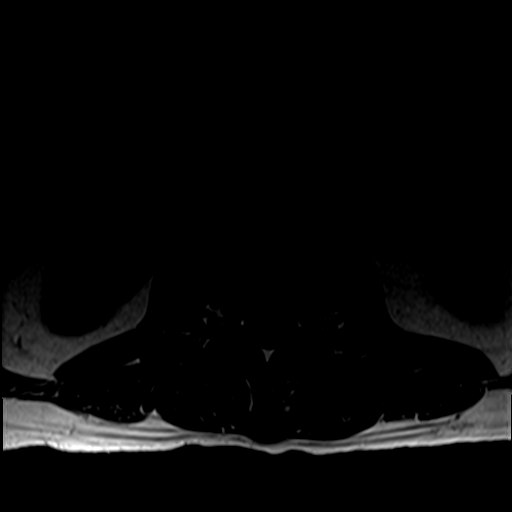
[im 27/40]
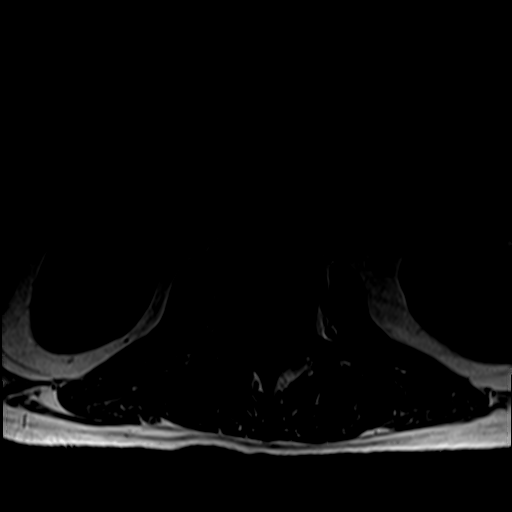
[im 35/40]
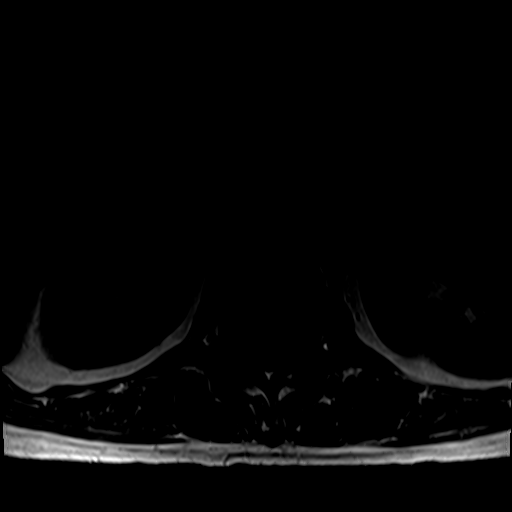
[im 40/40]
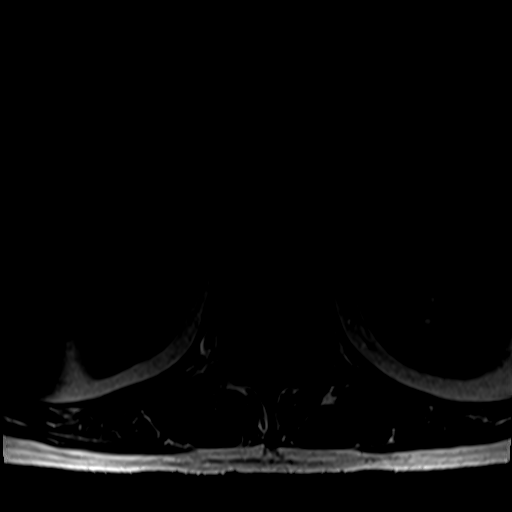

[Series 7: T1 fat-sat post-contrast · sagittal · 4.0mm · 0.88mm/px · 4 of 17 slices shown]
[im 1/17]
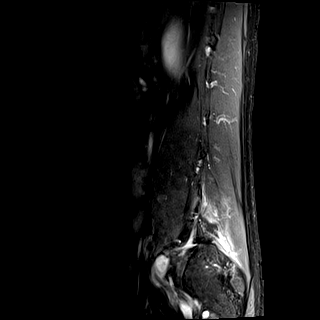
[im 6/17]
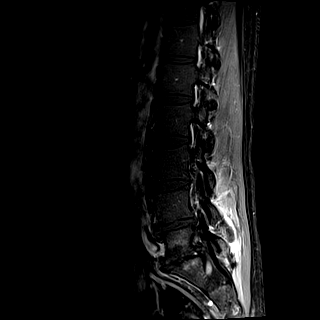
[im 11/17]
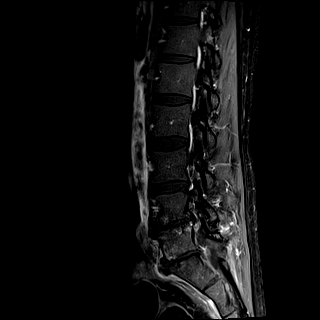
[im 17/17]
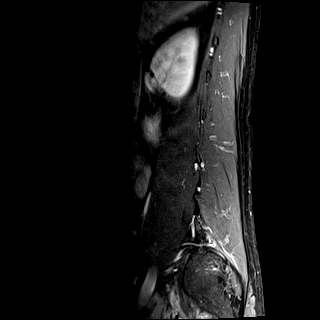

[34 of 48 positions shown; findings below may reference images not displayed]

FINDINGS: SEGMENTATION: For the purposes of this report, the last well-formed
intervertebral disc is reported as L5-S1.

ALIGNMENT: Straightened lumbar lordosis. No malalignment.

VERTEBRAE:Vertebral bodies are intact. Severe L4-5 and L5-S1 disc
height loss, with severe predominately subacute discogenic endplate
changes, disc edema L5-S1. Mild acute discogenic endplate changes
toward the RIGHT at L4-5 with faint enhancement most compatible with
reactive changes. No suspicious osseous or disc enhancement. Mild
L3-4 degenerative disc.

CONUS MEDULLARIS AND CAUDA EQUINA: Conus medullaris terminates at
T12-L1 and demonstrates normal morphology and signal
characteristics. No abnormal cord or leptomeningeal enhancement.
Cauda equina is normal.

PARASPINAL AND OTHER SOFT TISSUES: Moderate interstitial bright STIR
signal paraspinal muscles from L1-2 through L5-S1 on the LEFT, to
lesser extent on the RIGHT. Additional 12 x 9 mm and 12 x 8 mm rim
enhancing fluid collections about the LEFT L4-5 facet.

DISC LEVELS:

L1-2, L2-3: No disc bulge, canal stenosis nor neural foraminal
narrowing.

L3-4: 4 mm broad-based disc bulge, mild facet arthropathy and
ligamentum flavum redundancy. Mild canal stenosis. Minimal bilateral
caudal neural foraminal narrowing.

L4-5: 6 mm broad-based disc bulge with enhancing annular fissure.
Low T1, bright T2 nonenhancing 10 x 9 x 15 mm (transverse by AP by
CC) lesion RIGHT L5 lateral recess resulting in severe canal
stenosis. Thecal sac effacement at L5, narrowed to 2 mm. Faint
epidural enhancement. Moderate to severe RIGHT L4-5 neural foraminal
narrowing.

L5-S1: 5 mm broad-based disc bulge, enhancing annular fissure,
possible etiology of extrusion with central ventral epidural low
signal. Additional and potentially contiguous 6 x 9 x 11 mm low T1,
bright T2 nonenhancing collection RIGHT lateral epidural space. Mild
canal stenosis and lateral recess narrowing L5-S1. Moderate to
severe LEFT neural foraminal narrowing.
IMPRESSION: 1. Advanced degenerative change of the lower lumbar spine with disc
edema L5-S1, no convincing evidence of discitis osteomyelitis.
2. 10 x 9 x 15 mm L4-5 and 6 x 9 x 11 mm L5-S1 nonenhancing epidural
collections favoring disc extrusions, less likely abscess. Resultant
severe canal stenosis L4-5 and thecal sac effacement at L5, mild
canal stenosis L5-S1.
3. LEFT greater than RIGHT paraspinal muscle strain versus myositis.
Two LEFT L4-5 paraspinal fluid collections measuring to 12 x 9 mm
with appearance of facet synovial cysts, however abscess not
excluded.
4. Neural foraminal narrowing L3-4 through L5-S1: Moderate to severe
at L4-5 and L5-S1.

Acute findings discussed with and reconfirmed by Dr.AUJLA
